# Patient Record
Sex: Female | Born: 1955 | Race: White | Hispanic: No | Smoking: Former smoker
Health system: Southern US, Community
[De-identification: ages and names within clinical notes are randomized; demographics above are authoritative.]

## PROBLEM LIST (undated history)

## (undated) DIAGNOSIS — I10 Essential (primary) hypertension: Secondary | ICD-10-CM

## (undated) HISTORY — PX: FOOT SURGERY: SHX648

## (undated) HISTORY — PX: TONSILLECTOMY: SUR1361

---

## 2013-04-25 DIAGNOSIS — F329 Major depressive disorder, single episode, unspecified: Secondary | ICD-10-CM | POA: Insufficient documentation

## 2013-12-19 DIAGNOSIS — L237 Allergic contact dermatitis due to plants, except food: Secondary | ICD-10-CM | POA: Insufficient documentation

## 2015-09-05 ENCOUNTER — Other Ambulatory Visit: Payer: Self-pay | Admitting: Surgery

## 2015-09-05 DIAGNOSIS — S46912D Strain of unspecified muscle, fascia and tendon at shoulder and upper arm level, left arm, subsequent encounter: Secondary | ICD-10-CM

## 2015-09-05 DIAGNOSIS — S40012D Contusion of left shoulder, subsequent encounter: Secondary | ICD-10-CM

## 2015-09-14 ENCOUNTER — Ambulatory Visit
Admission: RE | Admit: 2015-09-14 | Discharge: 2015-09-14 | Disposition: A | Discharge status: Home / Self Care | Payer: Worker's Compensation | Source: Ambulatory Visit | Attending: Surgery | Admitting: Surgery

## 2015-09-14 DIAGNOSIS — S46912D Strain of unspecified muscle, fascia and tendon at shoulder and upper arm level, left arm, subsequent encounter: Secondary | ICD-10-CM

## 2015-09-14 DIAGNOSIS — S40012D Contusion of left shoulder, subsequent encounter: Secondary | ICD-10-CM

## 2015-09-24 DIAGNOSIS — M75112 Incomplete rotator cuff tear or rupture of left shoulder, not specified as traumatic: Secondary | ICD-10-CM | POA: Insufficient documentation

## 2015-10-02 ENCOUNTER — Other Ambulatory Visit: Payer: Self-pay | Admitting: Medical

## 2015-10-02 DIAGNOSIS — Z1231 Encounter for screening mammogram for malignant neoplasm of breast: Secondary | ICD-10-CM

## 2015-10-04 ENCOUNTER — Ambulatory Visit
Admission: RE | Admit: 2015-10-04 | Discharge: 2015-10-04 | Disposition: A | Discharge status: Home / Self Care | Payer: BLUE CROSS/BLUE SHIELD | Source: Ambulatory Visit | Attending: Medical | Admitting: Medical

## 2015-10-04 DIAGNOSIS — Z1231 Encounter for screening mammogram for malignant neoplasm of breast: Secondary | ICD-10-CM | POA: Diagnosis not present

## 2015-11-15 ENCOUNTER — Encounter: Payer: Self-pay | Admitting: *Deleted

## 2015-11-15 ENCOUNTER — Other Ambulatory Visit: Payer: Self-pay

## 2015-11-15 NOTE — Patient Instructions (Signed)
  Your procedure is scheduled on:11/27/15 Report to Day Surgery.  [facility]LO[facility] To find out your arrival time please call [phone] between 1PM - 3PM on 11/23/15  Remember: Instructions that are not followed completely may result in serious medical risk, up to and including death, or upon the discretion of your surgeon and anesthesiologist your surgery may need to be rescheduled.    __X__ 1. Do not eat food or drink liquids after midnight. No gum chewing or hard candies.     ___X_ 2. No Alcohol for 24 hours before or after surgery.   ____ 3. Bring all medications with you on the day of surgery if instructed.    __X__ 4. Notify your doctor if there is any change in your medical condition     (cold, fever, infections).     Do not wear jewelry, make-up, hairpins, clips or nail polish.  Do not wear lotions, powders, or perfumes. You may wear deodorant.  Do not shave 48 hours prior to surgery. Men may shave face and neck.  Do not bring valuables to the hospital.    [facility] any belongings or valuables.               Contacts, dentures or bridgework may not be worn into surgery.  Leave your suitcase in the car. After surgery it may be brought to your room.  For patients admitted to the hospital, discharge time is determined by your                treatment team.   Patients discharged the day of surgery will not be allowed to drive home.   Please read over the following fact sheets that you were given:   Surgical Site Infection Prevention   __X__ Take these medicines the morning of surgery with A SIP OF WATER:    1. METOPROLOL  2.   3.   4.  5.  6.  ____ Fleet Enema (as directed)   _X___ Use CHG Soap as directed  ____ Use inhalers on the day of surgery  ____ Stop metformin 2 days prior to surgery    ____ Take 1/2 of usual insulin dose the night before surgery and none on the morning of surgery.   ____ Stop Coumadin/Plavix/aspirin on   ____ Stop Anti-inflammatories on  ____ Stop supplements until after surgery.    ____ Bring C-Pap to the hospital.

## 2015-11-20 ENCOUNTER — Ambulatory Visit
Admission: RE | Admit: 2015-11-20 | Discharge: 2015-11-20 | Disposition: A | Discharge status: Home / Self Care | Payer: BLUE CROSS/BLUE SHIELD | Source: Ambulatory Visit | Attending: Surgery | Admitting: Surgery

## 2015-11-20 DIAGNOSIS — Z01812 Encounter for preprocedural laboratory examination: Secondary | ICD-10-CM | POA: Insufficient documentation

## 2015-11-20 DIAGNOSIS — Z01810 Encounter for preprocedural cardiovascular examination: Secondary | ICD-10-CM | POA: Insufficient documentation

## 2015-11-20 DIAGNOSIS — I10 Essential (primary) hypertension: Secondary | ICD-10-CM | POA: Insufficient documentation

## 2015-11-20 LAB — CBC
HCT: 39.5 % (ref 35.0–47.0)
HEMOGLOBIN: 13.4 g/dL (ref 12.0–16.0)
MCH: 29.7 pg (ref 26.0–34.0)
MCHC: 33.8 g/dL (ref 32.0–36.0)
MCV: 87.9 fL (ref 80.0–100.0)
Platelets: 291 10*3/uL (ref 150–440)
RBC: 4.49 MIL/uL (ref 3.80–5.20)
RDW: 13.1 % (ref 11.5–14.5)
WBC: 6 10*3/uL (ref 3.6–11.0)

## 2015-11-27 ENCOUNTER — Ambulatory Visit: Payer: Worker's Compensation | Admitting: Anesthesiology

## 2015-11-27 ENCOUNTER — Ambulatory Visit
Admission: RE | Admit: 2015-11-27 | Discharge: 2015-11-27 | Disposition: A | Discharge status: Home / Self Care | Payer: Worker's Compensation | Source: Ambulatory Visit | Attending: Surgery | Admitting: Surgery

## 2015-11-27 ENCOUNTER — Encounter
Admission: RE | Disposition: A | Discharge status: Home / Self Care | Payer: Self-pay | Source: Ambulatory Visit | Attending: Surgery

## 2015-11-27 DIAGNOSIS — M24112 Other articular cartilage disorders, left shoulder: Secondary | ICD-10-CM | POA: Insufficient documentation

## 2015-11-27 DIAGNOSIS — Z87892 Personal history of anaphylaxis: Secondary | ICD-10-CM | POA: Insufficient documentation

## 2015-11-27 DIAGNOSIS — Z79891 Long term (current) use of opiate analgesic: Secondary | ICD-10-CM | POA: Insufficient documentation

## 2015-11-27 DIAGNOSIS — Z91030 Bee allergy status: Secondary | ICD-10-CM | POA: Diagnosis not present

## 2015-11-27 DIAGNOSIS — Z87891 Personal history of nicotine dependence: Secondary | ICD-10-CM | POA: Diagnosis not present

## 2015-11-27 DIAGNOSIS — M19012 Primary osteoarthritis, left shoulder: Secondary | ICD-10-CM | POA: Insufficient documentation

## 2015-11-27 DIAGNOSIS — M75112 Incomplete rotator cuff tear or rupture of left shoulder, not specified as traumatic: Secondary | ICD-10-CM | POA: Diagnosis present

## 2015-11-27 DIAGNOSIS — M94212 Chondromalacia, left shoulder: Secondary | ICD-10-CM | POA: Diagnosis not present

## 2015-11-27 DIAGNOSIS — Z791 Long term (current) use of non-steroidal anti-inflammatories (NSAID): Secondary | ICD-10-CM | POA: Insufficient documentation

## 2015-11-27 DIAGNOSIS — Z91013 Allergy to seafood: Secondary | ICD-10-CM | POA: Insufficient documentation

## 2015-11-27 DIAGNOSIS — Z79899 Other long term (current) drug therapy: Secondary | ICD-10-CM | POA: Diagnosis not present

## 2015-11-27 DIAGNOSIS — Z91018 Allergy to other foods: Secondary | ICD-10-CM | POA: Diagnosis not present

## 2015-11-27 HISTORY — DX: Essential (primary) hypertension: I10

## 2015-11-27 HISTORY — PX: SHOULDER ARTHROSCOPY WITH OPEN ROTATOR CUFF REPAIR: SHX6092

## 2015-11-27 SURGERY — ARTHROSCOPY, SHOULDER WITH REPAIR, ROTATOR CUFF, OPEN
Anesthesia: General | Laterality: Left | Wound class: Clean

## 2015-11-27 MED ORDER — BUPIVACAINE-EPINEPHRINE 0.5% -1:200000 IJ SOLN
INTRAMUSCULAR | Status: DC | PRN
  Administered 2015-11-27 (×2): 30 mL

## 2015-11-27 MED ORDER — ROCURONIUM BROMIDE 100 MG/10ML IV SOLN
INTRAVENOUS | Status: DC | PRN
  Administered 2015-11-27: 5 mg via INTRAVENOUS

## 2015-11-27 MED ORDER — BUPIVACAINE-EPINEPHRINE (PF) 0.5% -1:200000 IJ SOLN
INTRAMUSCULAR | Status: AC
  Filled 2015-11-27: qty 30

## 2015-11-27 MED ORDER — FENTANYL CITRATE (PF) 100 MCG/2ML IJ SOLN
25.0000 ug | INTRAMUSCULAR | Status: DC | PRN
  Administered 2015-11-27 (×3): 50 ug via INTRAVENOUS

## 2015-11-27 MED ORDER — CEFAZOLIN SODIUM-DEXTROSE 2-4 GM/100ML-% IV SOLN
2.0000 g | Freq: Once | INTRAVENOUS | Status: AC
  Administered 2015-11-27: 2 g via INTRAVENOUS

## 2015-11-27 MED ORDER — FENTANYL CITRATE (PF) 100 MCG/2ML IJ SOLN
INTRAMUSCULAR | Status: AC
  Administered 2015-11-27: 50 ug via INTRAVENOUS
  Filled 2015-11-27: qty 2

## 2015-11-27 MED ORDER — HYDROMORPHONE HCL 1 MG/ML IJ SOLN
INTRAMUSCULAR | Status: AC
  Administered 2015-11-27: 0.5 mg via INTRAVENOUS
  Filled 2015-11-27: qty 1

## 2015-11-27 MED ORDER — FENTANYL CITRATE (PF) 100 MCG/2ML IJ SOLN
INTRAMUSCULAR | Status: DC | PRN
  Administered 2015-11-27: 50 ug via INTRAVENOUS
  Administered 2015-11-27: 100 ug via INTRAVENOUS
  Administered 2015-11-27: 50 ug via INTRAVENOUS

## 2015-11-27 MED ORDER — OXYCODONE HCL 5 MG/5ML PO SOLN: 5.0000 mg | Freq: Once | ORAL | Status: DC | PRN

## 2015-11-27 MED ORDER — CEFAZOLIN SODIUM-DEXTROSE 2-4 GM/100ML-% IV SOLN
INTRAVENOUS | Status: AC
  Filled 2015-11-27: qty 100

## 2015-11-27 MED ORDER — OXYCODONE HCL 5 MG PO TABS: 5.0000 mg | ORAL_TABLET | Freq: Once | ORAL | Status: DC | PRN

## 2015-11-27 MED ORDER — FAMOTIDINE 20 MG PO TABS
20.0000 mg | ORAL_TABLET | Freq: Once | ORAL | Status: AC
  Administered 2015-11-27: 20 mg via ORAL

## 2015-11-27 MED ORDER — LACTATED RINGERS IV SOLN
INTRAVENOUS | Status: DC
  Administered 2015-11-27 (×2): via INTRAVENOUS

## 2015-11-27 MED ORDER — SUCCINYLCHOLINE CHLORIDE 20 MG/ML IJ SOLN
INTRAMUSCULAR | Status: DC | PRN
  Administered 2015-11-27: 100 mg via INTRAVENOUS

## 2015-11-27 MED ORDER — EPINEPHRINE HCL 1 MG/ML IJ SOLN
INTRAMUSCULAR | Status: AC
  Filled 2015-11-27: qty 2

## 2015-11-27 MED ORDER — HYDROMORPHONE HCL 1 MG/ML IJ SOLN
0.2500 mg | INTRAMUSCULAR | Status: DC | PRN
  Administered 2015-11-27 (×2): 0.5 mg via INTRAVENOUS

## 2015-11-27 MED ORDER — ONDANSETRON HCL 4 MG/2ML IJ SOLN
INTRAMUSCULAR | Status: DC | PRN
  Administered 2015-11-27: 4 mg via INTRAVENOUS

## 2015-11-27 MED ORDER — METOCLOPRAMIDE HCL 5 MG/ML IJ SOLN
INTRAMUSCULAR | Status: AC
  Filled 2015-11-27: qty 2

## 2015-11-27 MED ORDER — EPHEDRINE SULFATE 50 MG/ML IJ SOLN
INTRAMUSCULAR | Status: DC | PRN
  Administered 2015-11-27 (×3): 10 mg via INTRAVENOUS

## 2015-11-27 MED ORDER — EPINEPHRINE HCL 1 MG/ML IJ SOLN
INTRAMUSCULAR | Status: DC | PRN
  Administered 2015-11-27: 2 mL

## 2015-11-27 MED ORDER — DEXAMETHASONE SODIUM PHOSPHATE 10 MG/ML IJ SOLN
INTRAMUSCULAR | Status: DC | PRN
  Administered 2015-11-27: 10 mg via INTRAVENOUS

## 2015-11-27 MED ORDER — ONDANSETRON HCL 4 MG/2ML IJ SOLN
4.0000 mg | Freq: Four times a day (QID) | INTRAMUSCULAR | Status: DC | PRN
  Administered 2015-11-27: 4 mg via INTRAVENOUS

## 2015-11-27 MED ORDER — METOCLOPRAMIDE HCL 10 MG PO TABS: 5.0000 mg | ORAL_TABLET | Freq: Three times a day (TID) | ORAL | Status: DC | PRN

## 2015-11-27 MED ORDER — PROPOFOL 10 MG/ML IV BOLUS
INTRAVENOUS | Status: DC | PRN
  Administered 2015-11-27: 130 mg via INTRAVENOUS

## 2015-11-27 MED ORDER — POTASSIUM CHLORIDE IN NACL 20-0.9 MEQ/L-% IV SOLN
INTRAVENOUS | Status: DC
Start: 2015-11-27 — End: 2015-11-27
  Filled 2015-11-27: qty 1000

## 2015-11-27 MED ORDER — GLYCOPYRROLATE 0.2 MG/ML IJ SOLN
INTRAMUSCULAR | Status: DC | PRN
  Administered 2015-11-27: 0.2 mg via INTRAVENOUS

## 2015-11-27 MED ORDER — OXYCODONE HCL 5 MG PO TABS: 5.0000 mg | ORAL_TABLET | ORAL | Status: DC | PRN

## 2015-11-27 MED ORDER — ROPIVACAINE HCL 5 MG/ML IJ SOLN
INTRAMUSCULAR | Status: AC
  Filled 2015-11-27: qty 40

## 2015-11-27 MED ORDER — MIDAZOLAM HCL 2 MG/2ML IJ SOLN
INTRAMUSCULAR | Status: DC | PRN
  Administered 2015-11-27 (×2): 1 mg via INTRAVENOUS

## 2015-11-27 MED ORDER — ONDANSETRON HCL 4 MG PO TABS: 4.0000 mg | ORAL_TABLET | Freq: Four times a day (QID) | ORAL | Status: DC | PRN

## 2015-11-27 MED ORDER — METOCLOPRAMIDE HCL 5 MG/ML IJ SOLN
5.0000 mg | Freq: Three times a day (TID) | INTRAMUSCULAR | Status: DC | PRN
  Administered 2015-11-27: 10 mg via INTRAVENOUS

## 2015-11-27 MED ORDER — ONDANSETRON HCL 4 MG/2ML IJ SOLN
INTRAMUSCULAR | Status: AC
  Filled 2015-11-27: qty 2

## 2015-11-27 MED ORDER — FAMOTIDINE 20 MG PO TABS
ORAL_TABLET | ORAL | Status: AC
  Administered 2015-11-27: 20 mg via ORAL
  Filled 2015-11-27: qty 1

## 2015-11-27 SURGICAL SUPPLY — 48 items
ANCHOR JUGGERKNOT WTAP NDL 2.9 (Anchor) ×3 IMPLANT
BIT DRILL JUGRKNT W/NDL BIT2.9 (DRILL) ×2 IMPLANT
BLADE FULL RADIUS 3.5 (BLADE) ×3 IMPLANT
BLADE SHAVER 4.5X7 STR FR (MISCELLANEOUS) ×3 IMPLANT
BUR ACROMIONIZER 4.0 (BURR) ×3 IMPLANT
BUR BR 5.5 WIDE MOUTH (BURR) ×3 IMPLANT
CANNULA 8.5X75 THRED (CANNULA) ×3 IMPLANT
CANNULA SHAVER 8MMX76MM (CANNULA) ×3 IMPLANT
CHLORAPREP W/TINT 26ML (MISCELLANEOUS) ×6 IMPLANT
COVER MAYO STAND STRL (DRAPES) ×3 IMPLANT
DRAPE IMP U-DRAPE 54X76 (DRAPES) ×3 IMPLANT
DRAPE SURG 17X11 SM STRL (DRAPES) ×3 IMPLANT
DRILL JUGGERKNOT W/NDL BIT 2.9 (DRILL) ×6
DRSG OPSITE POSTOP 4X8 (GAUZE/BANDAGES/DRESSINGS) ×3 IMPLANT
ELECT REM PT RETURN 9FT ADLT (ELECTROSURGICAL) ×3
ELECTRODE REM PT RTRN 9FT ADLT (ELECTROSURGICAL) ×1 IMPLANT
GAUZE PETRO XEROFOAM 1X8 (MISCELLANEOUS) ×3 IMPLANT
GAUZE SPONGE 4X4 12PLY STRL (GAUZE/BANDAGES/DRESSINGS) ×3 IMPLANT
GLOVE BIO SURGEON STRL SZ7.5 (GLOVE) ×6 IMPLANT
GLOVE BIO SURGEON STRL SZ8 (GLOVE) ×6 IMPLANT
GLOVE BIOGEL PI IND STRL 8 (GLOVE) ×1 IMPLANT
GLOVE BIOGEL PI INDICATOR 8 (GLOVE) ×2
GLOVE INDICATOR 8.0 STRL GRN (GLOVE) ×3 IMPLANT
GOWN STRL REUS W/ TWL LRG LVL3 (GOWN DISPOSABLE) ×2 IMPLANT
GOWN STRL REUS W/ TWL XL LVL3 (GOWN DISPOSABLE) ×1 IMPLANT
GOWN STRL REUS W/TWL LRG LVL3 (GOWN DISPOSABLE) ×4
GOWN STRL REUS W/TWL XL LVL3 (GOWN DISPOSABLE) ×2
GRASPER SUT 15 45D LOW PRO (SUTURE) ×6 IMPLANT
IV LACTATED RINGER IRRG 3000ML (IV SOLUTION) ×4
IV LR IRRIG 3000ML ARTHROMATIC (IV SOLUTION) ×2 IMPLANT
MANIFOLD NEPTUNE II (INSTRUMENTS) ×3 IMPLANT
MASK FACE SPIDER DISP (MASK) ×3 IMPLANT
MAT BLUE FLOOR 46X72 FLO (MISCELLANEOUS) ×3 IMPLANT
NEEDLE REVERSE CUT 1/2 CRC (NEEDLE) ×3 IMPLANT
PACK ARTHROSCOPY SHOULDER (MISCELLANEOUS) ×3 IMPLANT
SLING ARM LRG DEEP (SOFTGOODS) ×3 IMPLANT
SLING ULTRA II LG (MISCELLANEOUS) ×3 IMPLANT
STAPLER SKIN PROX 35W (STAPLE) ×3 IMPLANT
STRAP SAFETY BODY (MISCELLANEOUS) ×3 IMPLANT
SUT ETHIBOND 0 MO6 C/R (SUTURE) ×3 IMPLANT
SUT PROLENE 4 0 PS 2 18 (SUTURE) ×3 IMPLANT
SUT VIC AB 2-0 CT1 27 (SUTURE) ×4
SUT VIC AB 2-0 CT1 TAPERPNT 27 (SUTURE) ×2 IMPLANT
TAPE MICROFOAM 4IN (TAPE) ×3 IMPLANT
TUBING ARTHRO INFLOW-ONLY STRL (TUBING) ×3 IMPLANT
TUBING CONNECTING 10 (TUBING) ×2 IMPLANT
TUBING CONNECTING 10' (TUBING) ×1
WAND HAND CNTRL MULTIVAC 90 (MISCELLANEOUS) ×3 IMPLANT

## 2015-11-27 NOTE — Discharge Instructions (Addendum)
Keep dressing dry and intact.  May shower after dressing changed on post-op day #4 (Saturday).  Cover staples/sutures with Band-Aids after drying off. Apply ice frequently to shoulder. Keep shoulder immobilizer on at all times except may remove for bathing purposes. Follow-up in 10-14 days or as scheduled.  AMBULATORY SURGERY  DISCHARGE INSTRUCTIONS   1) The drugs that you were given will stay in your system until tomorrow so for the next 24 hours you should not:  A) Drive an automobile B) Make any legal decisions C) Drink any alcoholic beverage   2) You may resume regular meals tomorrow.  Today it is better to start with liquids and gradually work up to solid foods.  You may eat anything you prefer, but it is better to start with liquids, then soup and crackers, and gradually work up to solid foods.   3) Please notify your doctor immediately if you have any unusual bleeding, trouble breathing, redness and pain at the surgery site, drainage, fever, or pain not relieved by medication.    4) Additional Instructions:        Please contact your physician with any problems or Same Day Surgery at [phone], Monday through Friday 6 am to 4 pm, or [place] at [facility] Main number at [phone].

## 2015-11-27 NOTE — Op Note (Addendum)
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   Postoperative diagnosis: Impingement/tendinopathy with partial-thickness rotator cuff tear, labral fraying, and early degenerative joint disease, left shoulder.  Procedure: Extensive arthroscopic debridement, arthroscopic subacromial decompression, and mini-open rotator cuff repair, left shoulder.  Anesthesia: GET  Surgeon:   Maryagnes AmosJ. Jeffrey Poggi, MD  Assistant:   Horris LatinoLance McGhee, PA-C  Findings: As abothere was a near full-thickness articular surface rotator cuff tear involving the anterior insertional fibers of the supraspinatus. The remainder of the rotator cuff was in satisfactory condition. There was extensive labral fraying anteriorly, superiorly, and posteriorly. 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 The biceps tendon was in satisfactory condition.  Complications: None  Fluids:    700 cc  Estimated blood loss:  5 cc  Tourniquet time: None  Drains: None  Closure: Staples   Brief clinical note: The patient is is a 60 year old female who sustained a work-related injury to her left shoulder approximate 1 year ago. The patient's symptoms have progressed despite medications, activity modification, etc. The patient's history and examination are consistent with impingement/tendinopathy with a probable rotator cuff tear. These findings were confirmed by MRI scan. The patient presents at this time for definitive management of these shoulder symptoms.  Procedure: The patient was brought into the operating room and lain in the supine position. After adequate IV sedation was achieved, the patient underwent attempted placement of an interscalene block by the  anesthesiologist, but the patient could not tolerate the procedure despite good IV sedation so the procedure was aborted. The patient then underwent general endotracheal intubation and anesthesia before being repositioned in the beach chair position using the beach chair positioner. The left shoulder and upper extremity were prepped with ChloraPrep solution before being draped sterilely. Preoperative antibiotics were administered. A timeout was performed to confirm the proper surgical site before the expected portal sites and incision site were injected with 0.5% Sensorcaine with epinephrine. A posterior portal was created and the glenohumeral joint thoroughly inspected with the findings as described above. An anterior portal was created using an outside-in technique. The labrum and rotator cuff were further probed, again confirming the above-noted findings. The areas of labral fraying, synovitis, and articular damage were debrided back to stable margins using the full-radius resector. Subsequent probing of the labrum demonstrated that the labral attachment to the glenoid was intact. The biceps also was probed and found to have no significant tendinopathy changes. The ArthroCare wand was inserted and used to obtain hemostasis as well as to "anneal" the labrum anteriorly, superiorly, and posteriorly. The instruments were removed from the joint after suctioning the excess fluid.  The camera was repositioned through the posterior portal into the subacromial space. A separate lateral portal was created using an outside-in technique. The 3.5 mm full-radius resector was introduced and used to perform a subtotal bursectomy. The ArthroCare wand was then inserted and used to remove the periosteal tissue off the undersurface of the anterior third of the acromion as well as to recess the coracoacromial ligament from its attachment along the anterior and lateral margins of the acromion. The 4.0 mm acromionizing bur was  introduced and used to complete the decompression by removing the undersurface of the anterior third of the acromion. The full radius resector was reintroduced to  remove any residual bony debris before the ArthroCare wand was reintroduced to obtain hemostasis. The instruments were then removed from the subacromial space after suctioning the excess fluid.  An approximately 4-5 cm incision was made over the anterolateral aspect of the shoulder beginning at the anterolateral corner of the acromion and extending distally in line with the bicipital groove. This incision was carried down through the subcutaneous tissues to expose the deltoid fascia. The raphae between the anterior and middle thirds was identified and this plane developed to provide access into the subacromial space. Additional bursal tissues were debrided sharply using Metzenbaum scissors. The  partial-thickness articular surface rotator cuff tear was identified by palpation. A short incision was made longitudinally over this area to complete the tear before the margins were debrided sharply with a #15 blade and the exposed greater tuberosity roughened with a rongeur. The tear was repaired usia single Biomet 2.9 mm JuggerKnot anchor.  Both sets of sutures were tied in a side-to-side fashion to close the defect. Several additional #0 Ethibond interrupted sutures were used to complete the closure. An apparent watertight closure was obtained.  The wound was copiously irrigated with sterile saline solution before the deltoid raphae was reapproximated using 2-0 Vicryl interrupted sutures. The subcutaneous tissues were closed in two layers using 2-0 Vicryl interrupted sutures before the skin was closed using staples. The portal sites also were closed using staples. Because there was no block placed preoperatively, an additional 30 cc of 0.5% Sensorcaine with epinephrine was injected in and around the incisions, as well as in the subacromial space, in order  to help with postoperative analgesia. A sterile bulky dressing was applied to the shoulder before the arm was placed into a shoulder immobilizer. The patient was then awakened, extubated, and returned to the recovery room in satisfactory condition after tolerating the procedure well.

## 2015-11-27 NOTE — Transfer of Care (Signed)
Immediate Anesthesia Transfer of Care Note  Patient: Traci Ray  Procedure(s) Performed: Procedure(s): SHOULDER ARTHROSCOPY WITHDebridment, decompression, mini OPEN ROTATOR CUFF REPAIR (Left)  Patient Location: PACU  Anesthesia Type:General  Level of Consciousness: sedated and responds to stimulation  Airway & Oxygen Therapy: Patient Spontanous Breathing and Patient connected to nasal cannula oxygen  Post-op Assessment: Report given to RN and Post -op Vital signs reviewed and stable  Post vital signs: Reviewed and stable  Last Vitals:  Filed Vitals:   11/27/15 0604 11/27/15 0921  BP: 177/92 169/74  Pulse: 50 71  Temp: 36.2 C 36.2 C  Resp: 16 12    Last Pain:  Filed Vitals:   11/27/15 0927  PainSc: Asleep         Complications: No apparent anesthesia complications

## 2015-11-27 NOTE — Anesthesia Postprocedure Evaluation (Signed)
Anesthesia Post Note  Patient: Andrew AuFern Mechling  Procedure(s) Performed: Procedure(s) (LRB): SHOULDER ARTHROSCOPY WITHDebridment, decompression, mini OPEN ROTATOR CUFF REPAIR (Left)  Patient location during evaluation: PACU Anesthesia Type: General Level of consciousness: awake and alert Pain management: pain level controlled Vital Signs Assessment: post-procedure vital signs reviewed and stable Respiratory status: spontaneous breathing, nonlabored ventilation, respiratory function stable and patient connected to nasal cannula oxygen Cardiovascular status: blood pressure returned to baseline and stable Postop Assessment: no signs of nausea or vomiting Anesthetic complications: no    Last Vitals:  Filed Vitals:   11/27/15 1006 11/27/15 1035  BP: 153/89 175/81  Pulse: 51 51  Temp: 36.4 C 35.8 C  Resp: 13 16    Last Pain:  Filed Vitals:   11/27/15 1123  PainSc: 4                  Joseph K Piscitello

## 2015-11-27 NOTE — H&P (Signed)
Paper H&P to be scanned into permanent record. H&P reviewed. No changes. 

## 2015-11-27 NOTE — Anesthesia Procedure Notes (Signed)
Procedure Name: Intubation Date/Time: 11/27/2015 7:39 AM Performed by: Omer JackWEATHERLY, Dodi Leu Pre-anesthesia Checklist: Patient identified, Patient being monitored, Timeout performed, Emergency Drugs available and Suction available Patient Re-evaluated:Patient Re-evaluated prior to inductionOxygen Delivery Method: Circle system utilized Preoxygenation: Pre-oxygenation with 100% oxygen Intubation Type: IV induction Ventilation: Mask ventilation without difficulty Laryngoscope Size: Mac and 3 Grade View: Grade I Tube type: Oral Tube size: 7.0 mm Number of attempts: 1 Placement Confirmation: ETT inserted through vocal cords under direct vision,  positive ETCO2 and breath sounds checked- equal and bilateral Secured at: 21 cm Tube secured with: Tape Dental Injury: Teeth and Oropharynx as per pre-operative assessment

## 2015-11-27 NOTE — Anesthesia Preprocedure Evaluation (Addendum)
Anesthesia Evaluation  Patient identified by MRN, date of birth, ID band Patient awake    Reviewed: Allergy & Precautions, H&P , NPO status , Patient's Chart, lab work & pertinent test results  Airway Mallampati: III  TM Distance: >3 FB Neck ROM: limited    Dental  (+) Poor Dentition, Partial Upper, Partial Lower, Chipped, Missing   Pulmonary COPD, former smoker,    Pulmonary exam normal breath sounds clear to auscultation       Cardiovascular Exercise Tolerance: Good hypertension, (-) angina(-) Past MI and (-) DOE Normal cardiovascular exam Rhythm:regular Rate:Normal     Neuro/Psych negative neurological ROS  negative psych ROS   GI/Hepatic negative GI ROS, Neg liver ROS, neg GERD  ,  Endo/Other  negative endocrine ROS  Renal/GU negative Renal ROS  negative genitourinary   Musculoskeletal   Abdominal   Peds  Hematology negative hematology ROS (+)   Anesthesia Other Findings Past Medical History:   Hypertension                                                Past Surgical History:   TONSILLECTOMY                                                 FOOT SURGERY                                                 BMI    Body Mass Index   29.06 kg/m 2    Patient reports baseline weakness in surgical arm and shoulder.   Reproductive/Obstetrics negative OB ROS                            Anesthesia Physical Anesthesia Plan  ASA: III  Anesthesia Plan: General ETT   Post-op Pain Management:  Regional for Post-op pain   Induction:   Airway Management Planned:   Additional Equipment:   Intra-op Plan:   Post-operative Plan:   Informed Consent: I have reviewed the patients History and Physical, chart, labs and discussed the procedure including the risks, benefits and alternatives for the proposed anesthesia with the patient or authorized representative who has indicated his/her  understanding and acceptance.   Dental Advisory Given  Plan Discussed with: Anesthesiologist, CRNA and Surgeon  Anesthesia Plan Comments:         Anesthesia Quick Evaluation

## 2016-09-05 ENCOUNTER — Ambulatory Visit: Payer: Self-pay | Admitting: Medical

## 2016-09-05 ENCOUNTER — Encounter: Payer: Self-pay | Admitting: Medical

## 2016-09-05 VITALS — BP 112/78 | HR 64 | Temp 97.3°F | Resp 16 | Ht 64.0 in | Wt 180.0 lb

## 2016-09-05 DIAGNOSIS — M545 Low back pain, unspecified: Secondary | ICD-10-CM

## 2016-09-05 LAB — POCT URINALYSIS DIPSTICK
Bilirubin, UA: NEGATIVE
Glucose, UA: NEGATIVE
KETONES UA: NEGATIVE
Leukocytes, UA: NEGATIVE
Nitrite, UA: NEGATIVE
PH UA: 6
PROTEIN UA: NEGATIVE
UROBILINOGEN UA: NEGATIVE

## 2016-09-05 MED ORDER — CYCLOBENZAPRINE HCL 5 MG PO TABS: 5.0000 mg | ORAL_TABLET | Freq: Every day | ORAL | 0 refills | Status: DC

## 2016-09-05 MED ORDER — IBUPROFEN 600 MG PO TABS: 600.0000 mg | ORAL_TABLET | Freq: Three times a day (TID) | ORAL | 0 refills | Status: DC | PRN

## 2016-09-05 NOTE — Progress Notes (Signed)
   Subjective:    Patient ID: Traci Ray, female    DOB: [birth year], 61 y.o.   MRN: [id number]  [id number] yo female with  low back pain on right side x one month. No radiation. Patient decribes pain as dull ache. Working 2 jobs to help catch up due to recent left shoulder May 30th 2017 and being on disability.  Normally gets up at night to urinate. Denies burning, frequency, hematuria or fever. A friend suggested she come [facility] to be check for UTI. Did not work last night due to back pain. Needs a note for work.    Review of Systems  Constitutional: Negative for fever.  Eyes: Negative for itching.  Respiratory: Negative for shortness of breath.   Cardiovascular: Negative for chest pain.  Genitourinary: Negative for decreased urine volume, difficulty urinating, dysuria, flank pain and hematuria.  Musculoskeletal: Positive for back pain.  Neurological: Negative for numbness.  back pain located on right side.     Objective:   Physical Exam  Constitutional: She is oriented to person, place, and time. She appears well-developed and well-nourished.  HENT:  Head: Normocephalic and atraumatic.  Eyes: Pupils are equal, round, and reactive to light.  Musculoskeletal: Normal range of motion. She exhibits tenderness.  Neurological: She is alert and oriented to person, place, and time. She has normal strength.  Reflex Scores:      Patellar reflexes are 2+ on the right side and 2+ on the left side. Skin: Skin is warm, dry and intact.  mildly tender with palp on right side  , positive muscle spasm Negative SLR bilaterally        Assessment & Plan:  Given note "seen in clinic  09/05/16" Heat and ice to the area Treated with ibuprofen 600mg  and flexeril 5mg  escripted to patient's pharmacy. Return to clinic in 5-10 d if not improving sooner if worsening.

## 2016-09-05 NOTE — Patient Instructions (Signed)
Heat/ ice to area. Rest. Take ibuprofen and cyclobenzaprine as directed.  Return to clinic in 5-10 days if not improving.

## 2016-09-10 ENCOUNTER — Other Ambulatory Visit: Payer: Self-pay | Admitting: Medical

## 2016-10-09 ENCOUNTER — Other Ambulatory Visit: Payer: Self-pay | Admitting: Medical

## 2016-10-09 DIAGNOSIS — M545 Low back pain, unspecified: Secondary | ICD-10-CM

## 2016-10-09 MED ORDER — CYCLOBENZAPRINE HCL 5 MG PO TABS: 5.0000 mg | ORAL_TABLET | Freq: Every day | ORAL | 0 refills | Status: DC

## 2016-10-14 ENCOUNTER — Other Ambulatory Visit: Payer: Self-pay | Admitting: Medical

## 2016-10-14 DIAGNOSIS — Z1231 Encounter for screening mammogram for malignant neoplasm of breast: Secondary | ICD-10-CM

## 2016-10-15 ENCOUNTER — Ambulatory Visit
Admission: RE | Admit: 2016-10-15 | Discharge: 2016-10-15 | Disposition: A | Discharge status: Home / Self Care | Payer: BLUE CROSS/BLUE SHIELD | Source: Ambulatory Visit | Attending: Medical | Admitting: Medical

## 2016-10-15 ENCOUNTER — Ambulatory Visit: Payer: Self-pay | Admitting: Medical

## 2016-10-15 ENCOUNTER — Encounter: Payer: Self-pay | Admitting: Medical

## 2016-10-15 ENCOUNTER — Telehealth: Payer: Self-pay

## 2016-10-15 VITALS — BP 130/80 | HR 70 | Temp 97.5°F | Resp 12 | Wt 180.0 lb

## 2016-10-15 DIAGNOSIS — I10 Essential (primary) hypertension: Secondary | ICD-10-CM

## 2016-10-15 DIAGNOSIS — M545 Low back pain, unspecified: Secondary | ICD-10-CM

## 2016-10-15 DIAGNOSIS — Z1231 Encounter for screening mammogram for malignant neoplasm of breast: Secondary | ICD-10-CM | POA: Diagnosis not present

## 2016-10-15 DIAGNOSIS — Z760 Encounter for issue of repeat prescription: Secondary | ICD-10-CM

## 2016-10-15 MED ORDER — PREDNISONE 10 MG (21) PO TBPK: ORAL_TABLET | ORAL | 0 refills | Status: DC

## 2016-10-15 MED ORDER — CYCLOBENZAPRINE HCL 5 MG PO TABS: 5.0000 mg | ORAL_TABLET | Freq: Every day | ORAL | 0 refills | Status: DC

## 2016-10-15 MED ORDER — METOPROLOL SUCCINATE ER 50 MG PO TB24: 50.0000 mg | ORAL_TABLET | Freq: Every day | ORAL | 0 refills | Status: DC

## 2016-10-15 NOTE — Progress Notes (Signed)
   Subjective:    Patient ID: Traci Ray, female    DOB: 06-21-1956, 61 y.o.   MRN: [id number]  HPI 61 yo female with low back pain, seen on 09/05/16 for right sided back pain , so today worsening pain now presenting all the way across the lower back..  Recently returned to work after left should surgery. Now working just one job (last visit was working two jobs). Cannot afford to go to physical therapy.  No radiation of pain into legs, no numbness or tingling  Into legs. Would like some exercises to do to help with her back pain. "my back feels achy today".  No saddle paresthesia , no spontaneous loss of bowel or bladder. When I start working I get pain. 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 She says keys and walkie talkie bother her back.    Review of Systems  Constitutional: Negative for chills.  HENT: Positive for congestion and rhinorrhea. Negative for postnasal drip, sneezing and sore throat.   Eyes: Negative for itching.  Respiratory: Negative for cough, chest tightness and shortness of breath.   Cardiovascular: Negative for chest pain.  Gastrointestinal: Negative for diarrhea, nausea and vomiting.  Endocrine: Negative for cold intolerance and heat intolerance.  Genitourinary: Negative for dysuria.  Musculoskeletal: Positive for back pain. Negative for myalgias.  Allergic/Immunologic: Positive for environmental allergies and food allergies.  Neurological: Negative for dizziness, syncope and headaches.  Hematological: Negative for adenopathy.  Psychiatric/Behavioral: Negative for confusion and dysphoric mood.       Objective:   Physical Exam  Constitutional: She is oriented to person, place, and time. She appears well-developed and well-nourished.  HENT:  Head: Normocephalic and atraumatic.  Eyes: EOM are normal. Pupils are equal, round, and reactive to light.  Musculoskeletal: Normal range of motion.  Neurological: She is alert and oriented to person, place, and  time.  Skin: Skin is warm and dry.  Psychiatric: She has a normal mood and affect. Her behavior is normal.  Nursing note and vitals reviewed.   Non-tender to palpation vertebral spine and laterally. Easily bends over and touches the floor. Negative SLR 2+ bilateral popliteal and achilles reflexes.        Assessment & Plan:  Low back pain  Prescribed steroid dose pak   take as directed x 6 days #21 tab no refill and flexeril  one po qhs prn back spasms #30 no refill. Warm compresses to site. Exercises of pelvic tilt , knee(s) to chest  Single knee and both knees. Side twist of knees, lunges, ankle crossover opposite knee and stretching downward. Encouraged core strengthening. She is going to start doing yoga and working out at [employer].  Hypertension refilled patient Metoprolol Succ ER 50 mg one daily 90 day supply no refills Return to clinic as needed.

## 2016-10-23 NOTE — Telephone Encounter (Signed)
No note

## 2016-11-05 ENCOUNTER — Ambulatory Visit: Payer: Self-pay | Admitting: Medical

## 2016-11-05 DIAGNOSIS — I10 Essential (primary) hypertension: Secondary | ICD-10-CM | POA: Insufficient documentation

## 2016-11-05 DIAGNOSIS — M545 Low back pain, unspecified: Secondary | ICD-10-CM

## 2016-11-05 MED ORDER — CYCLOBENZAPRINE HCL 5 MG PO TABS: 5.0000 mg | ORAL_TABLET | Freq: Every day | ORAL | 0 refills | Status: DC

## 2016-11-05 NOTE — Progress Notes (Signed)
Subjective:    Patient ID: Traci Ray, female    DOB: [birth year], [age] y.o.   MRN: [id number]  HPI 61 yo returns for recheck of back pain seen on  09/05/16  and  10/15/16.  Has quit her 2nd job 2 weeks ago , which seems to have helped her back. Now still has back pain on the left side today rating it  3/10 today, no radiation into legs and  no numbness or tinglingl into legs. No saddle paraesthesia or  Loss of bowel or bladder. Would like to have a refill of muscle relaxers.  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  Traci Ray says she had some help today from a  colleague. Also states she now has a shorter scrub brush to clean showers , the other brush was 5 feet long and hard to scrub with. 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 She also now is carrying her walkie talkie in a bag instead of having to carry it around her hips.   Review of Systems  Constitutional: Negative for chills and fever.  HENT: Negative for congestion, ear pain, rhinorrhea and sneezing.   Eyes: Negative for discharge and itching.  Respiratory: Negative for chest tightness, shortness of breath and wheezing.   Cardiovascular: Negative for chest pain, palpitations and leg swelling.  Gastrointestinal: Negative for diarrhea, nausea and vomiting.  Endocrine: Negative for cold intolerance and heat intolerance.  Genitourinary: Negative for dysuria and hematuria.  Musculoskeletal: Positive for back pain. Negative for gait problem, joint swelling and myalgias.  Allergic/Immunologic: Positive for environmental allergies and food allergies.  Neurological: Negative for  syncope, facial asymmetry and light-headedness.  Hematological: Negative for adenopathy.  Psychiatric/Behavioral: Negative for confusion and hallucinations.   Her food allergy is shellfish.    Objective:   Physical Exam  Constitutional: She appears well-developed and well-nourished.  HENT:  Head: Normocephalic and atraumatic.  Eyes: Conjunctivae and EOM are normal. Pupils are equal, round, and reactive to light.  Neck: Normal range of motion.  Musculoskeletal: Normal range of motion.       Lumbar back: She exhibits no tenderness, no bony tenderness and no swelling.  Neurological: She is alert. She has normal strength. No cranial nerve deficit or sensory deficit. GCS eye subscore is 4. GCS verbal subscore is 5. GCS motor subscore is 6.  Reflex Scores:      Patellar reflexes are 2+ on the right side and 2+ on the left side.      Achilles reflexes are 2+ on the right side and 2+ on the left side. She is pleasant today and does not seem in pain. Negative straight leg bilaterally. 5/5 strength on flexion and extension of lower extremities. Gait within normal limits      Assessment & Plan:  Back pain improving refill of Cyclobenzaprine 5 mg one by mouth at night as needed for muscle spasm.. #30 no refills. She is using Ben-gay 2 to 3 times per week. Reviewed with patient not to use cool or warm compress at the same time as Ben-Gay. Patient says  Advil also  helps. No question at discharge. Reviewed with patient if she would like x-ray or MRI I will be happy to do the order. She declines any further test at this time. Return to clinic as needed. Rests after work. Uses Ice periodically and uses hot shower which also helps.

## 2016-11-05 NOTE — Patient Instructions (Signed)
Rest as needed.  Use warm or cool compresses without Ben-Gay as needed. Return to the clinic as needed.

## 2016-12-08 ENCOUNTER — Ambulatory Visit: Payer: Self-pay

## 2016-12-17 ENCOUNTER — Ambulatory Visit: Payer: Self-pay

## 2016-12-18 ENCOUNTER — Encounter: Payer: Self-pay | Admitting: Medical

## 2016-12-18 ENCOUNTER — Ambulatory Visit: Payer: Self-pay | Admitting: *Deleted

## 2016-12-18 VITALS — BP 142/90 | HR 59 | Temp 97.1°F | Resp 16 | Wt 180.0 lb

## 2016-12-18 DIAGNOSIS — I10 Essential (primary) hypertension: Secondary | ICD-10-CM

## 2016-12-22 ENCOUNTER — Ambulatory Visit: Payer: Self-pay | Admitting: Medical

## 2016-12-22 VITALS — BP 152/86 | HR 62 | Temp 97.8°F | Wt 178.6 lb

## 2016-12-22 DIAGNOSIS — M545 Low back pain, unspecified: Secondary | ICD-10-CM

## 2016-12-22 DIAGNOSIS — I10 Essential (primary) hypertension: Secondary | ICD-10-CM

## 2016-12-22 DIAGNOSIS — G8929 Other chronic pain: Secondary | ICD-10-CM

## 2016-12-22 DIAGNOSIS — M25512 Pain in left shoulder: Secondary | ICD-10-CM

## 2016-12-22 NOTE — Progress Notes (Signed)
   Subjective:    Patient ID: Traci Ray, female    DOB: 12-14-1955, 61 y.o.   MRN: [id number]  HPI 61 yo female comes in today for low back pain, but no pain now.  Last episode of pain 2 weeks ago. Moving furntiure on carpet. No radiation of pain.  No numbness or tingling. Also compaining of the itching and numbness and dull ache on left side of shoulder s/p rotator cuff repair 10/2015.  Has had this since repair "it's just aggravating" . Uses Voltaren gel to help with discomfort. Sometimes uses Voltaren gel on lower back for pain.   Patient concerned about blood pressure being elevated. Felt  1.5 weeks ago nauseated and lightheaded. Feels that the shoulder is causing her blood pressure to be elevated.   Review of Systems  Constitutional: Negative for appetite change, chills and fever.  HENT: Negative.   Eyes: Negative.   Respiratory: Negative.   Cardiovascular: Negative.   Gastrointestinal: Negative.   Genitourinary: Negative for dysuria and hematuria.  Musculoskeletal: Positive for back pain.  Skin: Negative for rash.  Allergic/Immunologic: Negative for environmental allergies.  Neurological: Positive for light-headedness. Negative for dizziness and syncope.  Hematological: Negative for adenopathy.  Psychiatric/Behavioral: Negative for confusion and hallucinations.   No back pain or light-headedness now.    Objective:   Physical Exam  Constitutional: She is oriented to person, place, and time. She appears well-developed and well-nourished.  HENT:  Head: Normocephalic and atraumatic.  Eyes: EOM are normal. Pupils are equal, round, and reactive to light.  Neck: Normal range of motion.  Musculoskeletal: Normal range of motion.  Neurological: She is alert and oriented to person, place, and time.  Skin: Skin is warm and dry.  Psychiatric: She has a normal mood and affect. Her behavior is normal. Judgment and thought content normal.  Nursing note and vitals reviewed.   Can easily  rotate left shoulder.  5/5 stength on flexion / extension  of lower legs.  2+ popliteal and achilles reflexes. , SLR negative bilaterally. Non-tender vertebral lumbar area, no muscle spasm today.Skin on back and shoulders within normal limits.     Assessment & Plan:   Low back pain no sciatica. Will do x-rays of Lumbar area. Patient has muscle relaxers she can use at home. Asked for narcotic pain medication for back at work, reviewed with patient this is not a good plan for treatment. Can try a prescription NSID to see if she can get relief when back is painful.  Hypertension to monitor.Come into clinic if feeling naseous and light-headed. For blood pressure recheck and evaluation.  And to come in next 3 mornings for nurse visit blood pressure recheck Coninued  Left shoulder tingling and numbness after surgery. If bothering her alot, to follow up with the surgeon who did the repair. Explained to patient about subcutaneous nerves being cut during procedure, can lead to numbness and tingling of the skin. Come into clinic if feeling naseous and lightheaded for evaluation.

## 2016-12-23 ENCOUNTER — Ambulatory Visit: Payer: Self-pay | Admitting: *Deleted

## 2016-12-23 VITALS — BP 152/92 | HR 55 | Temp 97.7°F | Resp 16

## 2016-12-23 DIAGNOSIS — I10 Essential (primary) hypertension: Secondary | ICD-10-CM

## 2016-12-23 MED ORDER — ETODOLAC 400 MG PO TABS: 400.0000 mg | ORAL_TABLET | Freq: Every day | ORAL | 1 refills | Status: DC

## 2016-12-23 NOTE — Patient Instructions (Signed)
Lumbar spine x-ray Lodine 400mg  one per day for left shoulder pain. Monitor blood pressure at clinic and at home.

## 2016-12-23 NOTE — Progress Notes (Signed)
   Subjective:    Patient ID: Traci Ray, female    DOB: [birth year], 61 y.o.   MRN: [id number]  HPI 61 yo female came in today for blood pressure recheck  152/92. Woke up feeling dizzy and nauseated. Was able to eat a breakfast bar and yogurt. Denies being dizzy or nauseated now.  Dizziness and nausesated symptoms  come and go . Not affecting her eating or drinking.   Review of Systems  Constitutional: Negative for appetite change.  Respiratory: Negative for shortness of breath.   Cardiovascular: Negative for chest pain.  Gastrointestinal: Positive for nausea.  Neurological: Positive for dizziness. Negative for syncope.       Objective:   Physical Exam  Constitutional: She is oriented to person, place, and time. She appears well-developed and well-nourished.  HENT:  Head: Normocephalic and atraumatic.  Right Ear: External ear normal.  Left Ear: External ear normal.  Mouth/Throat: Oropharynx is clear and moist.  Eyes: Conjunctivae and EOM are normal. Pupils are equal, round, and reactive to light.  Neck: Normal range of motion. Neck supple.  Cardiovascular: Normal rate, regular rhythm and normal heart sounds.   Pulmonary/Chest: Effort normal and breath sounds normal.  Neurological: She is alert and oriented to person, place, and time.  Skin: Skin is warm and dry.  Psychiatric: She has a normal mood and affect. Her behavior is normal. Thought content normal.  Nursing note and vitals reviewed.         Assessment & Plan:  Hypertension.  Will have patient go home and take another metoprolol 50 mg  And recheck blood pressure tomorrow. Reviewed with patient if not feeling well or dizzy after taking medication to return to the clinc for further examination. She says she got dizzy at home and ate something with her pill.  Reviewed with patient to drink more fluids ( water) due to the heat and working long shift hours. She verbalizes the understanding to return to the clinic if  not feeling better today. No questions at discharge.

## 2016-12-24 ENCOUNTER — Ambulatory Visit: Payer: Self-pay

## 2016-12-24 ENCOUNTER — Ambulatory Visit: Payer: Self-pay | Admitting: Medical

## 2016-12-24 VITALS — BP 128/80 | HR 54 | Temp 97.6°F | Resp 18 | Ht 65.0 in | Wt 177.0 lb

## 2016-12-24 VITALS — BP 146/88

## 2016-12-24 DIAGNOSIS — I10 Essential (primary) hypertension: Secondary | ICD-10-CM

## 2016-12-24 MED ORDER — HYDROCHLOROTHIAZIDE 12.5 MG PO TABS: 12.5000 mg | ORAL_TABLET | Freq: Every day | ORAL | 0 refills | Status: DC

## 2016-12-24 NOTE — Progress Notes (Signed)
   Subjective:    Patient ID: Traci Ray, female    DOB: 10-11-1955, 61 y.o.   MRN: [id number]  HPI  61 yo female returns for blood pressure  Recheck. . Improved however patent still feels dizzy and has a headache at the base of the head.  Hx Migraine.  Took  2 pills of the metoprolol to day.    Review of Systems  Constitutional: Negative for chills, fatigue and fever.  HENT: Negative for rhinorrhea and sore throat.   Eyes: Negative.   Respiratory: Negative for shortness of breath.   Cardiovascular: Negative for chest pain.  Gastrointestinal: Negative for abdominal pain.  Endocrine: Negative for polydipsia, polyphagia and polyuria.  Genitourinary: Negative for dysuria.  Musculoskeletal: Positive for back pain.  Neurological: Positive for dizziness and headaches. Negative for syncope.   Pending lumbar spine xray, patient just has not gone to get xray yet,    Objective:   Physical Exam  Constitutional: She is oriented to person, place, and time. She appears well-developed and well-nourished.  HENT:  Head: Normocephalic and atraumatic.  Eyes: EOM are normal. Pupils are equal, round, and reactive to light.  Neck: Normal range of motion.  Cardiovascular: Normal rate, regular rhythm and normal heart sounds.  Exam reveals no gallop and no friction rub.   No murmur heard. Pulmonary/Chest: Effort normal and breath sounds normal.  Neurological: She is alert and oriented to person, place, and time. She has normal strength. No cranial nerve deficit or sensory deficit. She displays a negative Romberg sign. GCS eye subscore is 4. GCS verbal subscore is 5. GCS motor subscore is 6.  Skin: Skin is warm and dry.  Psychiatric: She has a normal mood and affect. Her behavior is normal. Judgment and thought content normal.  Nursing note and vitals reviewed.     recheck  Hr  62. Assessment & Plan:  Hypertension, blood pressure improved however lower heart rate. This maybe causing her dizziness  and  Headache. Will check  CBC w/diff and Met C today.  Stop 2nd metoprolol and will start HCTZ 25 mg /day tomorrow. Patient says she cannot get medication till she gets paid on Friday. Will call patient with test results tomorrow. Summer cleaning stops August 24 th.Taking some vacation next week.Hydrate with water.  Rest for today.

## 2016-12-24 NOTE — Progress Notes (Signed)
Pt presents for NV/ Blood Pressure Check. Pt states that she does not feel that she can work today due to adjustment to change in medication. She states that she feels "funky" and lists sx as: slightly dizzy and nauseated. She states that she needs a day to adjust and requests a note to excuse her from work. She set up an appointment to see the provider this afternoon.

## 2016-12-25 ENCOUNTER — Telehealth: Payer: Self-pay | Admitting: Medical

## 2016-12-25 LAB — CMP12+LP+TP+TSH+6AC+CBC/D/PLT
ALBUMIN: 4.4 g/dL (ref 3.5–5.5)
ALT: 20 IU/L (ref 0–32)
AST: 19 IU/L (ref 0–40)
Albumin/Globulin Ratio: 1.6 (ref 1.2–2.2)
Alkaline Phosphatase: 77 IU/L (ref 39–117)
BUN/Creatinine Ratio: 20 (ref 9–23)
BUN: 18 mg/dL (ref 6–24)
Basophils Absolute: 0 10*3/uL (ref 0.0–0.2)
Basos: 0 %
Bilirubin Total: 0.3 mg/dL (ref 0.0–1.2)
CALCIUM: 9.7 mg/dL (ref 8.7–10.2)
CHOL/HDL RATIO: 5.4 ratio — AB (ref 0.0–4.4)
CREATININE: 0.92 mg/dL (ref 0.57–1.00)
Chloride: 103 mmol/L (ref 96–106)
Cholesterol, Total: 210 mg/dL — ABNORMAL HIGH (ref 100–199)
EOS (ABSOLUTE): 0.2 10*3/uL (ref 0.0–0.4)
Eos: 3 %
Estimated CHD Risk: 1.5 times avg. — ABNORMAL HIGH (ref 0.0–1.0)
Free Thyroxine Index: 1.7 (ref 1.2–4.9)
GFR calc Af Amer: 79 mL/min/{1.73_m2} (ref >59)
GFR, EST NON AFRICAN AMERICAN: 69 mL/min/{1.73_m2} (ref >59)
GGT: 10 IU/L (ref 0–60)
GLOBULIN, TOTAL: 2.7 g/dL (ref 1.5–4.5)
Glucose: 85 mg/dL (ref 65–99)
HDL: 39 mg/dL — ABNORMAL LOW (ref >39)
Hematocrit: 41.7 % (ref 34.0–46.6)
Hemoglobin: 14.4 g/dL (ref 11.1–15.9)
IRON: 94 ug/dL (ref 27–159)
Immature Grans (Abs): 0 10*3/uL (ref 0.0–0.1)
Immature Granulocytes: 0 %
LDH: 272 IU/L — AB (ref 119–226)
LYMPHS: 38 %
Lymphocytes Absolute: 2.5 10*3/uL (ref 0.7–3.1)
MCH: 30.3 pg (ref 26.6–33.0)
MCHC: 34.5 g/dL (ref 31.5–35.7)
MCV: 88 fL (ref 79–97)
MONOS ABS: 0.4 10*3/uL (ref 0.1–0.9)
Monocytes: 7 %
NEUTROS ABS: 3.5 10*3/uL (ref 1.4–7.0)
Neutrophils: 52 %
PHOSPHORUS: 4.3 mg/dL (ref 2.5–4.5)
POTASSIUM: 4.2 mmol/L (ref 3.5–5.2)
Platelets: 315 10*3/uL (ref 150–379)
RBC: 4.75 x10E6/uL (ref 3.77–5.28)
RDW: 13.9 % (ref 12.3–15.4)
Sodium: 145 mmol/L — ABNORMAL HIGH (ref 134–144)
T3 Uptake Ratio: 22 % — ABNORMAL LOW (ref 24–39)
T4 TOTAL: 7.8 ug/dL (ref 4.5–12.0)
TOTAL PROTEIN: 7.1 g/dL (ref 6.0–8.5)
TRIGLYCERIDES: 451 mg/dL — AB (ref 0–149)
TSH: 2.6 u[IU]/mL (ref 0.450–4.500)
Uric Acid: 5 mg/dL (ref 2.5–7.1)
WBC: 6.8 10*3/uL (ref 3.4–10.8)

## 2016-12-25 LAB — VITAMIN D 25 HYDROXY (VIT D DEFICIENCY, FRACTURES): VIT D 25 HYDROXY: 27.3 ng/mL — AB (ref 30.0–100.0)

## 2016-12-25 NOTE — Telephone Encounter (Signed)
Called patient today , she is getting blood pressure medication HCTZ  now, Reviewed labs. They are non fasting  And so lipids are invalid. Patient did not feel well again today and would like a note for work. Will complete and leave at front desk. Released.  Also recommended   Vit D3  2000IU/day

## 2016-12-29 ENCOUNTER — Ambulatory Visit: Payer: Self-pay | Admitting: *Deleted

## 2016-12-29 VITALS — BP 145/92 | HR 80

## 2016-12-29 DIAGNOSIS — I10 Essential (primary) hypertension: Secondary | ICD-10-CM

## 2017-01-01 ENCOUNTER — Ambulatory Visit: Payer: Self-pay | Admitting: *Deleted

## 2017-01-01 VITALS — BP 150/90 | HR 67

## 2017-01-01 DIAGNOSIS — I10 Essential (primary) hypertension: Secondary | ICD-10-CM

## 2017-01-09 ENCOUNTER — Ambulatory Visit: Payer: Self-pay

## 2017-01-09 VITALS — BP 130/76

## 2017-01-09 DIAGNOSIS — I10 Essential (primary) hypertension: Secondary | ICD-10-CM

## 2017-01-11 ENCOUNTER — Other Ambulatory Visit: Payer: Self-pay | Admitting: Medical

## 2017-01-11 DIAGNOSIS — I10 Essential (primary) hypertension: Secondary | ICD-10-CM

## 2017-01-13 ENCOUNTER — Other Ambulatory Visit: Payer: Self-pay | Admitting: Medical

## 2017-01-13 DIAGNOSIS — I10 Essential (primary) hypertension: Secondary | ICD-10-CM

## 2017-01-13 MED ORDER — METOPROLOL SUCCINATE ER 50 MG PO TB24: 50.0000 mg | ORAL_TABLET | Freq: Every day | ORAL | 0 refills | Status: AC

## 2017-01-13 MED ORDER — HYDROCHLOROTHIAZIDE 12.5 MG PO TABS: 12.5000 mg | ORAL_TABLET | Freq: Every day | ORAL | 0 refills | Status: AC

## 2017-01-13 NOTE — Telephone Encounter (Signed)
A refill of her Metoprolol and HCTZ has been completed.

## 2017-01-13 NOTE — Telephone Encounter (Signed)
Needs refill on blood pressure medication. Metoprolol and HCTZ. Completed for  90 days then recheck of blood pressure and labs.

## 2017-01-28 NOTE — Progress Notes (Unsigned)
Pt here for bp f/u, nurse appt. Did not need to see provider. Lurene ShadowBAllen, RN

## 2017-08-05 ENCOUNTER — Ambulatory Visit: Payer: Self-pay | Admitting: Medical

## 2017-08-19 ENCOUNTER — Encounter: Payer: Self-pay | Admitting: Medical

## 2017-08-19 ENCOUNTER — Ambulatory Visit: Payer: Self-pay | Admitting: Medical

## 2017-08-19 VITALS — BP 157/89 | HR 55 | Temp 97.0°F | Resp 18 | Ht 66.0 in | Wt 180.6 lb

## 2017-08-19 DIAGNOSIS — J0100 Acute maxillary sinusitis, unspecified: Secondary | ICD-10-CM

## 2017-08-19 DIAGNOSIS — R0981 Nasal congestion: Secondary | ICD-10-CM

## 2017-08-19 MED ORDER — AMOXICILLIN-POT CLAVULANATE 875-125 MG PO TABS: 1.0000 | ORAL_TABLET | Freq: Two times a day (BID) | ORAL | 0 refills | Status: DC

## 2017-08-19 MED ORDER — FLUTICASONE PROPIONATE 50 MCG/ACT NA SUSP: 2.0000 | Freq: Every day | NASAL | 6 refills | Status: DC

## 2017-08-19 NOTE — Progress Notes (Signed)
   Subjective:    Patient ID: Traci Ray, female    DOB: 1955/11/09, 62 y.o.   MRN: [id number]  HPI   62 yo female in non acute distress.  Patient complains of  2 week history of cold like symptoms, nasal congestion. Denies fever or chills, but felt hot at night. With yellow clear discharge Saturday night with left ear pain and throbbing 3/10 with the worst being  5/10 on Saturday ngiht..Feels "clogged".Dizzy initially for a week , but now it is gone.   Primary Care Provider  Jacklynn Ganonganiel Carter  Started therapy on left Shoulder again  5.5 weeks ago.Doing dry needling which is helping. Recent diagnosis of Sleep Apnea. But does not want to rent machine and deposit of $500 dollars.  Review of Systems  Constitutional: Positive for chills (initially first week), fatigue and fever (first week).  HENT: Positive for congestion, ear pain (saturday night), postnasal drip, sinus pressure and sinus pain. Negative for rhinorrhea, sneezing, sore throat and tinnitus.   Respiratory: Negative for cough.   Cardiovascular: Negative for chest pain.  Gastrointestinal: Negative for abdominal pain.  Genitourinary: Negative for dysuria.  Musculoskeletal: Positive for myalgias (2 night during the first week).  Skin: Negative for rash.  Neurological: Positive for dizziness ( 2 weeks ago), light-headedness (in the beginning) and headaches (off an on). Negative for syncope.  Hematological: Negative for adenopathy.       Objective:   Physical Exam  Constitutional: She is oriented to person, place, and time. She appears well-developed and well-nourished.  HENT:  Head: Normocephalic and atraumatic.  Right Ear: External ear normal.  Left Ear: External ear normal.  Eyes: Conjunctivae and EOM are normal. Pupils are equal, round, and reactive to light.  Neck: Normal range of motion. Neck supple.  Cardiovascular: Normal rate, regular rhythm and normal heart sounds.  Pulmonary/Chest: Effort normal and breath sounds  normal.  Musculoskeletal: Normal range of motion.  Neurological: She is alert and oriented to person, place, and time.  Skin: Skin is warm and dry.  Psychiatric: She has a normal mood and affect. Her behavior is normal. Judgment and thought content normal.  Nursing note and vitals reviewed.         Assessment & Plan:  Sinusitis maillary Meds ordered this encounter  Medications  . amoxicillin-clavulanate (AUGMENTIN) 875-125 MG tablet    Sig: Take 1 tablet by mouth 2 (two) times daily.    Dispense:  20 tablet    Refill:  0  Return in 3-5 days if not improving . Patient verbalizes understanding and has no questions at discharge. Patient asked for prescription of Flonase at discharge  due to hers at home being over a year old.  Prescription sent to pharmacy. Reviewed with patient Sleep apnea and hypertension and heart disease connection. To follow up with her doctor as needed.

## 2017-08-19 NOTE — Patient Instructions (Signed)

## 2017-10-12 ENCOUNTER — Ambulatory Visit: Payer: Self-pay | Admitting: Medical

## 2017-10-12 VITALS — BP 159/83 | HR 58 | Temp 97.7°F | Resp 18 | Ht 66.0 in | Wt 179.8 lb

## 2017-10-12 DIAGNOSIS — L089 Local infection of the skin and subcutaneous tissue, unspecified: Secondary | ICD-10-CM

## 2017-10-12 DIAGNOSIS — M79675 Pain in left toe(s): Secondary | ICD-10-CM

## 2017-10-12 NOTE — Progress Notes (Incomplete)
   Subjective:    Patient ID: Traci Ray, female    DOB: 1955/12/02, 62 y.o.   MRN: [id number]  HPI  62 yo female in non acute distress with history of  2 week left great toe pain. "My nail started to come off ( for the 3rd time) and I cut it way back to so it would not catch on my sock or shoe." It has been red and tender and draining pus for  2 days.  Denies any fever or chills or numbess or tingling.   There were no vitals taken for this visit.  Review of Systems  Constitutional: Negative for chills and fever.  Skin: Positive for wound (draining pus, left great toe).       Objective:   Physical Exam  Constitutional: Traci Ray is oriented to person, place, and time. Traci Ray appears well-developed and well-nourished.  Eyes: Pupils are equal, round, and reactive to light. Conjunctivae and EOM are normal.  Neurological: Traci Ray is alert and oriented to person, place, and time.  Skin: There is erythema (left great toe).  Psychiatric: Traci Ray has a normal mood and affect. Her behavior is normal. Judgment and thought content normal.  Nursing note and vitals reviewed.    Left great toe is swollen with erythema, toenail is cut back1/2 way where it is draining pus from underneath the toe nail. Patient had no dressing on the toe. Limited ROM secondary to swelling.<2sec CR     Assessment & Plan:  Left great toe infection. Reviewed with patient how to clean and dress toe, cleaned and neosporin placed on toe and  dressed in clinic. To take OTC Motrin 400 mg-600 mg every 6 hours with food for no more than  7 days, to help with pain and swelling. Referral made to podiatry. Given hand written prescription  Keflex 500 mg po TID x 7 days #21 no refills.  To clean toe twice daily, neosporin to the site and bandage to the area.  Patieent verbalizes understanding and has no questions at discharge.

## 2017-10-12 NOTE — Addendum Note (Signed)
Addended by: Alexas Basulto, Herbert SetaHEATHER R on: 10/12/2017 03:28 PM   Modules accepted: Orders

## 2017-10-12 NOTE — Progress Notes (Addendum)
   Subjective:    Patient ID: Traci Ray, female    DOB: 12/04/1955, 62 y.o.   MRN: [id number]  HPI  62 yo female in non acute distress with history of  2 week left great toe pain. "My nail started to come off ( for the 3rd time) and I cut it way back to so it would not catch on my sock or shoe." It has been red and tender and draining pus for  2 days.  Denies any fever or chills or numbess or tingling.   There were no vitals taken for this visit.  Review of Systems  Constitutional: Negative for chills and fever.  Skin: Positive for wound (draining pus, left great toe).       Objective:   Physical Exam  Constitutional: She is oriented to person, place, and time. She appears well-developed and well-nourished.  Eyes: Pupils are equal, round, and reactive to light. Conjunctivae and EOM are normal.  Neurological: She is alert and oriented to person, place, and time.  Skin: There is erythema (left great toe).  Psychiatric: She has a normal mood and affect. Her behavior is normal. Judgment and thought content normal.  Nursing note and vitals reviewed.    Left great toe is swollen with erythema, toenail is cut back where itis draining pus from underneath the toe nail. Patient had no dressing on the toe. Limited ROM secondary to swelling.<2sec CR     Assessment & Plan:  Left great toe infection. Reviewed with patient how to clean and dress toe, cleaned and neosporin placed on toe and  dressed in clinic. To take OTC Motrin 400mg -600mg  every 6 hours to help with pain and swelling. Referral made to podiatry. Given hand written prescription  Keflex 500mg  po TID x 7 days #21 no refills.  To clean toe twice daily, neosporin to the site and bandage to the area.  Patieent verbalizes understanding and has no questions at discharge.

## 2017-10-19 ENCOUNTER — Other Ambulatory Visit: Payer: Self-pay | Admitting: Student

## 2017-10-19 DIAGNOSIS — Z1231 Encounter for screening mammogram for malignant neoplasm of breast: Secondary | ICD-10-CM

## 2017-10-22 ENCOUNTER — Ambulatory Visit: Payer: Self-pay | Admitting: Podiatry

## 2017-10-24 ENCOUNTER — Other Ambulatory Visit: Payer: Self-pay | Admitting: Medical

## 2017-10-24 DIAGNOSIS — I10 Essential (primary) hypertension: Secondary | ICD-10-CM

## 2017-10-27 ENCOUNTER — Ambulatory Visit
Admission: RE | Admit: 2017-10-27 | Discharge: 2017-10-27 | Disposition: A | Discharge status: Home / Self Care | Payer: BLUE CROSS/BLUE SHIELD | Source: Ambulatory Visit | Attending: Student | Admitting: Student

## 2017-10-27 DIAGNOSIS — Z1231 Encounter for screening mammogram for malignant neoplasm of breast: Secondary | ICD-10-CM | POA: Insufficient documentation

## 2017-11-02 ENCOUNTER — Telehealth: Payer: Self-pay | Admitting: *Deleted

## 2017-11-05 ENCOUNTER — Ambulatory Visit (INDEPENDENT_AMBULATORY_CARE_PROVIDER_SITE_OTHER): Payer: BLUE CROSS/BLUE SHIELD | Admitting: Podiatry

## 2017-11-05 ENCOUNTER — Encounter: Payer: Self-pay | Admitting: Podiatry

## 2017-11-05 VITALS — BP 152/85 | HR 56

## 2017-11-05 DIAGNOSIS — G4733 Obstructive sleep apnea (adult) (pediatric): Secondary | ICD-10-CM | POA: Insufficient documentation

## 2017-11-05 DIAGNOSIS — L603 Nail dystrophy: Secondary | ICD-10-CM | POA: Diagnosis not present

## 2017-11-05 DIAGNOSIS — M216X9 Other acquired deformities of unspecified foot: Secondary | ICD-10-CM

## 2017-11-05 DIAGNOSIS — I10 Essential (primary) hypertension: Secondary | ICD-10-CM | POA: Insufficient documentation

## 2017-11-05 DIAGNOSIS — Q828 Other specified congenital malformations of skin: Secondary | ICD-10-CM | POA: Diagnosis not present

## 2017-11-06 NOTE — Progress Notes (Signed)
This patient presents to the office with chief complaint of her left big toenail keeps coming off.  She says she injures herself in 2016 and injures her left side.  She says she was examined but her toenail was never examined only her left great toe.  She says her nail came off and regrew.  It came off again and is now regrowing.  She says she has experienced redness and swelling and pain on occasion but today there are no symptoms.  She wishes to discuss this nail at this visit.  General Appearance  Alert, conversant and in no acute stress.  Vascular  Dorsalis pedis and posterior tibial  pulses are palpable  bilaterally.  Capillary return is within normal limits  bilaterally. Temperature is within normal limits  bilaterally.  Neurologic  Senn-Weinstein monofilament wire test within normal limits  bilaterally. Muscle power within normal limits bilaterally.  Nails Thick disfigured discolored nails with subungual debris  from hallux left great toenail.. No evidence of bacterial infection or drainage bilaterally. Nail is unattached to the nail bed.  Orthopedic  No limitations of motion of motion feet .  No crepitus or effusions noted.  No bony pathology or digital deformities noted.  Skin  normotropic skin .  Porokeratosis sub 4  B/L.  Nail dystrophy Porokeratosis.  IE  Explained this patient has injured nail bed leading to the nails falling off.  We plan on nail surgery in July for permanent nail removal surgery.  Told to get insoles for her porokeratosis. RTC nail surgery.   Helane Gunther DPM

## 2018-01-14 ENCOUNTER — Ambulatory Visit (INDEPENDENT_AMBULATORY_CARE_PROVIDER_SITE_OTHER): Payer: BLUE CROSS/BLUE SHIELD | Admitting: Podiatry

## 2018-01-14 ENCOUNTER — Encounter: Payer: Self-pay | Admitting: Podiatry

## 2018-01-14 DIAGNOSIS — L603 Nail dystrophy: Secondary | ICD-10-CM

## 2018-01-14 DIAGNOSIS — B351 Tinea unguium: Secondary | ICD-10-CM

## 2018-01-14 DIAGNOSIS — M79675 Pain in left toe(s): Secondary | ICD-10-CM

## 2018-01-14 NOTE — Progress Notes (Signed)
This patient presents to the office with chief complaint of her left big toenail keeps coming off.  She is scheduled for nail surgery for the permanent removal of the left great toe today.  She says she is not having any pain or discomfort or swelling due to this nail.    General Appearance  Alert, conversant and in no acute stress.  Vascular  Dorsalis pedis and posterior tibial  pulses are palpable  bilaterally.  Capillary return is within normal limits  bilaterally. Temperature is within normal limits  bilaterally.  Neurologic  Senn-Weinstein monofilament wire test within normal limits  bilaterally. Muscle power within normal limits bilaterally.  Nails Thick disfigured discolored nails with subungual debris  from hallux left great toenail.. No evidence of bacterial infection or drainage bilaterally. Nail is unattached to the nail bed.  Orthopedic  No limitations of motion of motion feet .  No crepitus or effusions noted.  No bony pathology or digital deformities noted.  Skin  normotropic skin .  Porokeratosis sub 4  B/L.  Nail dystrophy   Return office visit  Discuss the surgery with this patient and we decided to hold off on performing the surgery due to the fact the nail is pain free.  RTC prn.   Helane GuntherGregory Athira Janowicz DPM

## 2018-02-25 ENCOUNTER — Ambulatory Visit: Payer: BLUE CROSS/BLUE SHIELD | Admitting: Adult Health

## 2018-02-25 ENCOUNTER — Encounter: Payer: Self-pay | Admitting: Adult Health

## 2018-02-25 VITALS — BP 142/97 | HR 62 | Temp 98.0°F | Resp 16 | Ht 66.0 in | Wt 178.0 lb

## 2018-02-25 DIAGNOSIS — J3089 Other allergic rhinitis: Secondary | ICD-10-CM

## 2018-02-25 MED ORDER — FLUTICASONE PROPIONATE 50 MCG/ACT NA SUSP: 2.0000 | Freq: Every day | NASAL | 3 refills | Status: DC

## 2018-02-25 MED ORDER — LORATADINE 10 MG PO TABS: 10.0000 mg | ORAL_TABLET | Freq: Every day | ORAL | 3 refills | Status: DC

## 2018-02-25 NOTE — Progress Notes (Addendum)
Subjective:     Patient ID: Traci Ray, female   DOB: Mar 10, 1956, 62 y.o.   MRN: [id number]  HPI   Blood pressure (!) 142/97, pulse 62, temperature 98 F (36.7 C), resp. rate 16, height 5\' 6"  (1.676 m), weight 178 lb (80.7 kg), SpO2 97 %.  Patient is a 62 year old female in no acute distress who comes to the clinic and reports her allergies are flaring she has had watery eyes, pressure in bilateral ears, runny nose all time. She reports she has been out of her flonase nasal spray for two months. Post nasal drip.She reports she always has seasonal allergies that are worse this time of year.   She sees PCP at [facility] for blood pressure and primary care. No decongestants.  Discussed elevated reading at today's visit - she reports she has follow up with her PCP in around two weeks and will monitor. She reports this week has been stressful with classes starting back at [employer].  Denies any pain or pain with any radiation.  Patient  denies any fever, body aches,chills, rash, chest pain, shortness of breath, nausea, vomiting, or diarrhea.   Denies any recent hospitalizations or illness.  No LMP recorded. Patient is postmenopausal.  Review of Systems  Constitutional: Negative.  Negative for activity change, appetite change, chills, diaphoresis, fatigue, fever and unexpected weight change.  HENT: Positive for congestion, postnasal drip, rhinorrhea, sneezing and sore throat. Negative for dental problem, drooling, ear discharge, ear pain, facial swelling, hearing loss, mouth sores, nosebleeds, sinus pressure, sinus pain, tinnitus, trouble swallowing and voice change.        Ear fullness/ pressure bilaterally. Denies any pain or drainage bilaterally.   Eyes: Positive for discharge (bilateral watery itchijng eyes at times mostly when coming in from outside per patient) and itching. Negative for photophobia, pain, redness and visual disturbance.  Respiratory: Negative.   Cardiovascular:  Negative.   Gastrointestinal: Negative.   Endocrine: Negative.   Genitourinary: Negative.   Musculoskeletal: Negative.   Skin: Negative.   Allergic/Immunologic: Positive for environmental allergies and food allergies. Negative for immunocompromised state.  Neurological: Negative.   Hematological: Negative.   Psychiatric/Behavioral: Negative.        Objective:   Physical Exam  Constitutional: She is oriented to person, place, and time. She appears well-developed and well-nourished. She is active.  Non-toxic appearance. She does not have a sickly appearance. She does not appear ill. No distress.  HENT:  Head: Normocephalic and atraumatic.  Right Ear: Hearing, external ear and ear canal normal. No tenderness. Tympanic membrane is not perforated and not erythematous. A middle ear effusion is present.  Left Ear: Hearing, external ear and ear canal normal. No tenderness. Tympanic membrane is not perforated and not erythematous. A middle ear effusion is present.  Nose: Rhinorrhea present. No mucosal edema. Right sinus exhibits no maxillary sinus tenderness and no frontal sinus tenderness. Left sinus exhibits no maxillary sinus tenderness and no frontal sinus tenderness.  Mouth/Throat: Uvula is midline and mucous membranes are normal. No oral lesions. No uvula swelling. Posterior oropharyngeal erythema (mild ) present. No oropharyngeal exudate, posterior oropharyngeal edema or tonsillar abscesses. Tonsils are 0 on the right. Tonsils are 0 on the left.   Cobblestoning posterior pharynx; bilateral allergic shiners; bilateral TMs air fluid level clear; bilateral nasal turbinates mild edema erythema clear discharge;     Eyes: Pupils are equal, round, and reactive to light. Conjunctivae and EOM are normal. Right eye exhibits no discharge. Left eye  exhibits no discharge. No scleral icterus.  Neck: Normal range of motion. Neck supple. No JVD present. No tracheal deviation present.  Cardiovascular: Normal  rate, regular rhythm, normal heart sounds and intact distal pulses. Exam reveals no gallop and no friction rub.  No murmur heard. Pulmonary/Chest: Effort normal and breath sounds normal. No accessory muscle usage or stridor. No respiratory distress. She has no decreased breath sounds. She has no wheezes. She has no rales. She exhibits no tenderness.  Abdominal: Soft. Bowel sounds are normal.  Musculoskeletal: Normal range of motion.  Lymphadenopathy:       Head (right side): No submental, no submandibular, no tonsillar, no preauricular, no posterior auricular and no occipital adenopathy present.       Head (left side): No submental, no submandibular, no tonsillar, no preauricular, no posterior auricular and no occipital adenopathy present.    She has no cervical adenopathy.  Neurological: She is alert and oriented to person, place, and time. She has normal strength. She displays normal reflexes. No cranial nerve deficit. She exhibits normal muscle tone. Coordination normal.  Skin: Skin is warm, dry and intact. Capillary refill takes less than 2 seconds. No rash noted. She is not diaphoretic. No erythema. No pallor. Nails show no clubbing.  Psychiatric: She has a normal mood and affect. Her speech is normal and behavior is normal. Judgment and thought content normal. Cognition and memory are normal.  Vitals reviewed.      Assessment:      Plan:     Follow up with PCP for blood pressure and allergic rhinitis/ seasonal allergies within 1- 2 weeks from today's appointment.   Meds ordered this encounter  Medications  . loratadine (CLARITIN) 10 MG tablet    Sig: Take 1 tablet (10 mg total) by mouth daily.    Dispense:  30 tablet    Refill:  3    Patient does not want this filled until 03/05/18 and will pick up this day  . fluticasone (FLONASE) 50 MCG/ACT nasal spray    Sig: Place 2 sprays into both nostrils daily.    Dispense:  16 g    Refill:  3    Patient does not want this filled until  03/05/18 and will pick up this day  Avoid and decongestants. Avoid caffeine.   Advised patient call the office or your primary care doctor for an appointment if no improvement within 72 hours or if any symptoms change or worsen at any time  Advised ER or urgent Care if after hours or on weekend. Call 911 for emergency symptoms at any time.Patinet verbalized understanding of all instructions given/reviewed and treatment plan and has no further questions or concerns at this time.    Patient verbalized understanding of all instructions given and denies any further questions at this time.

## 2018-02-25 NOTE — Patient Instructions (Signed)
Loratadine tablets What is this medicine? LORATADINE (lor AT a deen) is an antihistamine. It helps to relieve sneezing, runny nose, and itchy, watery eyes. This medicine is used to treat the symptoms of allergies. It is also used to treat itchy skin rash and hives. This medicine may be used for other purposes; ask your health care provider or pharmacist if you have questions. COMMON BRAND NAME(S): Alavert, Allergy Relief, Claritin, Claritin Hives Relief, Clear-Atadine, QlearQuil All Day & All Night Allergy Relief, Tavist ND What should I tell my health care provider before I take this medicine? They need to know if you have any of these conditions: -asthma -kidney disease -liver disease -an unusual or allergic reaction to loratadine, other antihistamines, other medicines, foods, dyes, or preservatives -pregnant or trying to get pregnant -breast-feeding How should I use this medicine? Take this medicine by mouth with a glass of water. Follow the directions on the label. You may take this medicine with food or on an empty stomach. Take your medicine at regular intervals. Do not take your medicine more often than directed. Talk to your pediatrician regarding the use of this medicine in children. While this medicine may be used in children as young as 6 years for selected conditions, precautions do apply. Overdosage: If you think you have taken too much of this medicine contact a poison control center or emergency room at once. NOTE: This medicine is only for you. Do not share this medicine with others. What if I miss a dose? If you miss a dose, take it as soon as you can. If it is almost time for your next dose, take only that dose. Do not take double or extra doses. What may interact with this medicine? -other medicines for colds or allergies This list may not describe all possible interactions. Give your health care provider a list of all the medicines, herbs, non-prescription drugs, or dietary  supplements you use. Also tell them if you smoke, drink alcohol, or use illegal drugs. Some items may interact with your medicine. What should I watch for while using this medicine? Tell your doctor or healthcare professional if your symptoms do not start to get better or if they get worse. Your mouth may get dry. Chewing sugarless gum or sucking hard candy, and drinking plenty of water may help. Contact your doctor if the problem does not go away or is severe. You may get drowsy or dizzy. Do not drive, use machinery, or do anything that needs mental alertness until you know how this medicine affects you. Do not stand or sit up quickly, especially if you are an older patient. This reduces the risk of dizzy or fainting spells. What side effects may I notice from receiving this medicine? Side effects that you should report to your doctor or health care professional as soon as possible: -allergic reactions like skin rash, itching or hives, swelling of the face, lips, or tongue -breathing problems -unusually restless or nervous Side effects that usually do not require medical attention (report to your doctor or health care professional if they continue or are bothersome): -drowsiness -dry or irritated mouth or throat -headache This list may not describe all possible side effects. Call your doctor for medical advice about side effects. You may report side effects to FDA at 1-800-FDA-1088. Where should I keep my medicine? Keep out of the reach of children. Store at room temperature between 2 and 30 degrees C (36 and 86 degrees F). Protect from moisture. Throw away any  unused medicine after the expiration date. NOTE: This sheet is a summary. It may not cover all possible information. If you have questions about this medicine, talk to your doctor, pharmacist, or health care provider.  2018 Elsevier/Gold Standard (2007-12-20 17:17:24) Fluticasone nasal spray What is this medicine? FLUTICASONE (floo TIK  a sone) is a corticosteroid. This medicine is used to treat the symptoms of allergies like sneezing, itchy red eyes, and itchy, runny, or stuffy nose. This medicine is also used to treat nasal polyps. This medicine may be used for other purposes; ask your health care provider or pharmacist if you have questions. COMMON BRAND NAME(S): Flonase, Flonase Allergy Relief, Flonase Sensimist, Veramyst, XHANCE What should I tell my health care provider before I take this medicine? They need to know if you have any of these conditions: -cataracts -glaucoma -infection, like tuberculosis, herpes, or fungal infection -recent surgery on nose or sinuses -taking a corticosteroid by mouth -an unusual or allergic reaction to fluticasone, steroids, other medicines, foods, dyes, or preservatives -pregnant or trying to get pregnant -breast-feeding How should I use this medicine? This medicine is for use in the nose. Follow the directions on your product or prescription label. This medicine works best if used at regular intervals. Do not use more often than directed. Make sure that you are using your nasal spray correctly. After 6 months of daily use for allergies, talk to your doctor or health care professional before using it for a longer time. Ask your doctor or health care professional if you have any questions. Talk to your pediatrician regarding the use of this medicine in children. Special care may be needed. Some products have been used for allergies in children as young as 2 years. After 2 months of daily use without a prescription in a child, talk to your pediatrician before using it for a longer time. Use of this medicine for nasal polyps is not approved in children. Overdosage: If you think you have taken too much of this medicine contact a poison control center or emergency room at once. NOTE: This medicine is only for you. Do not share this medicine with others. What if I miss a dose? If you miss a dose,  use it as soon as you remember. If it is almost time for your next dose, use only that dose and continue with your regular schedule. Do not use double or extra doses. What may interact with this medicine? -certain antibiotics like clarithromycin and telithromycin -certain medicines for fungal infections like ketoconazole, itraconazole, and voriconazole -conivaptan -nefazodone -some medicines for HIV -vaccines This list may not describe all possible interactions. Give your health care provider a list of all the medicines, herbs, non-prescription drugs, or dietary supplements you use. Also tell them if you smoke, drink alcohol, or use illegal drugs. Some items may interact with your medicine. What should I watch for while using this medicine? Visit your doctor or health care professional for regular checks on your progress. Some symptoms may improve within 12 hours after starting use. Check with your doctor or health care professional if there is no improvement in your symptoms after 3 weeks of use. This medicine may increase your risk of getting an infection. Tell your doctor or health care professional if you are around anyone with measles or chickenpox, or if you develop sores or blisters that do not heal properly. What side effects may I notice from receiving this medicine? Side effects that you should report to your doctor or health  care professional as soon as possible: -allergic reactions like skin rash, itching or hives, swelling of the face, lips, or tongue -changes in vision -crusting or sores in the nose -nosebleed -signs and symptoms of infection like fever or chills; cough; sore throat -white patches or sores in the mouth or nose Side effects that usually do not require medical attention (report to your doctor or health care professional if they continue or are bothersome): -burning or irritation inside the nose or throat -cough -headache -unusual taste or smell This list may not  describe all possible side effects. Call your doctor for medical advice about side effects. You may report side effects to FDA at 1-800-FDA-1088. Where should I keep my medicine? Keep out of the reach of children. Store at room temperature between 15 and 30 degrees C (59 and 86 degrees F). Avoid exposure to extreme heat, cold, or light. Throw away any unused medicine after the expiration date. NOTE: This sheet is a summary. It may not cover all possible information. If you have questions about this medicine, talk to your doctor, pharmacist, or health care provider.  2018 Elsevier/Gold Standard (2016-03-28 14:23:12) Allergic Rhinitis, Adult Allergic rhinitis is an allergic reaction that affects the mucous membrane inside the nose. It causes sneezing, a runny or stuffy nose, and the feeling of mucus going down the back of the throat (postnasal drip). Allergic rhinitis can be mild to severe. There are two types of allergic rhinitis:  Seasonal. This type is also called hay fever. It happens only during certain seasons.  Perennial. This type can happen at any time of the year.  What are the causes? This condition happens when the body's defense system (immune system) responds to certain harmless substances called allergens as though they were germs.  Seasonal allergic rhinitis is triggered by pollen, which can come from grasses, trees, and weeds. Perennial allergic rhinitis may be caused by:  House dust mites.  Pet dander.  Mold spores.  What are the signs or symptoms? Symptoms of this condition include:  Sneezing.  Runny or stuffy nose (nasal congestion).  Postnasal drip.  Itchy nose.  Tearing of the eyes.  Trouble sleeping.  Daytime sleepiness.  How is this diagnosed? This condition may be diagnosed based on:  Your medical history.  A physical exam.  Tests to check for related conditions, such as: ? Asthma. ? Pink eye. ? Ear infection. ? Upper respiratory  infection.  Tests to find out which allergens trigger your symptoms. These may include skin or blood tests.  How is this treated? There is no cure for this condition, but treatment can help control symptoms. Treatment may include:  Taking medicines that block allergy symptoms, such as antihistamines. Medicine may be given as a shot, nasal spray, or pill.  Avoiding the allergen.  Desensitization. This treatment involves getting ongoing shots until your body becomes less sensitive to the allergen. This treatment may be done if other treatments do not help.  If taking medicine and avoiding the allergen does not work, new, stronger medicines may be prescribed.  Follow these instructions at home:  Find out what you are allergic to. Common allergens include smoke, dust, and pollen.  Avoid the things you are allergic to. These are some things you can do to help avoid allergens: ? Replace carpet with wood, tile, or vinyl flooring. Carpet can trap dander and dust. ? Do not smoke. Do not allow smoking in your home. ? Change your heating and air conditioning filter at  least once a month. ? During allergy season:  Keep windows closed as much as possible.  Plan outdoor activities when pollen counts are lowest. This is usually during the evening hours.  When coming indoors, change clothing and shower before sitting on furniture or bedding.  Take over-the-counter and prescription medicines only as told by your health care provider.  Keep all follow-up visits as told by your health care provider. This is important. Contact a health care provider if:  You have a fever.  You develop a persistent cough.  You make whistling sounds when you breathe (you wheeze).  Your symptoms interfere with your normal daily activities. Get help right away if:  You have shortness of breath. Summary  This condition can be managed by taking medicines as directed and avoiding allergens.  Contact your  health care provider if you develop a persistent cough or fever.  During allergy season, keep windows closed as much as possible. This information is not intended to replace advice given to you by your health care provider. Make sure you discuss any questions you have with your health care provider. Document Released: 03/11/2001 Document Revised: 07/24/2016 Document Reviewed: 07/24/2016 Elsevier Interactive Patient Education  [employer].

## 2018-03-25 ENCOUNTER — Ambulatory Visit: Payer: BLUE CROSS/BLUE SHIELD | Admitting: Podiatry

## 2018-04-29 ENCOUNTER — Ambulatory Visit: Payer: BLUE CROSS/BLUE SHIELD | Admitting: Adult Health

## 2018-04-29 ENCOUNTER — Encounter: Payer: Self-pay | Admitting: Adult Health

## 2018-04-29 VITALS — BP 143/82 | HR 66 | Temp 98.2°F | Resp 16 | Ht 65.0 in | Wt 182.0 lb

## 2018-04-29 DIAGNOSIS — I10 Essential (primary) hypertension: Secondary | ICD-10-CM

## 2018-04-29 DIAGNOSIS — J302 Other seasonal allergic rhinitis: Secondary | ICD-10-CM

## 2018-04-29 MED ORDER — FLUTICASONE PROPIONATE 50 MCG/ACT NA SUSP: 2.0000 | Freq: Every day | NASAL | 3 refills | Status: AC

## 2018-04-29 MED ORDER — LORATADINE 10 MG PO TABS: 10.0000 mg | ORAL_TABLET | Freq: Every day | ORAL | 3 refills | Status: AC

## 2018-04-29 NOTE — Progress Notes (Addendum)
Subjective:     Patient ID: Traci Ray, female   DOB: [birth year], 62 y.o.   MRN: [id number]  Blood pressure (!) 143/82, pulse 66, temperature 98.2 F (36.8 C), resp. rate 16, height 5\' 5"  (1.651 m), weight 182 lb (82.6 kg), SpO2 96 %. HPI Patient is a 62 year old female in no acute distress who comes to the office for a blood pressure check she reports she has an appointment with her primary care doctor tomorrow 11/1 and would like to take reading with her. She has not been taking blood pressure readings at home.   Stress at work.   Patient  denies any fever, body aches,chills, rash, chest pain, shortness of breath, nausea, vomiting, or diarrhea.   She reports she is having ear fullness and ear pressure intermittent over the past two weeks. Rhinorrhea at times. She requests refills for the above over the counter medications - she has not been using these medications for last few months.   Review of Systems  Constitutional: Negative.   HENT: Positive for congestion and rhinorrhea. Negative for dental problem, drooling, ear discharge, ear pain, facial swelling, hearing loss, mouth sores, nosebleeds, postnasal drip, sinus pressure, sinus pain, sneezing, sore throat, tinnitus, trouble swallowing and voice change.   Eyes: Negative.   Respiratory: Negative.   Cardiovascular: Negative.   Gastrointestinal: Negative.   Endocrine: Negative.   Genitourinary: Negative.   Musculoskeletal: Positive for arthralgias (" always" ). Negative for back pain, gait problem, joint swelling, myalgias, neck pain and neck stiffness.  Skin: Negative.   Allergic/Immunologic: Negative.   Neurological: Negative.   Hematological: Negative.   Psychiatric/Behavioral: Negative.        Objective:   Physical Exam  Constitutional: She is oriented to person, place, and time. Vital signs are normal. She appears well-developed and well-nourished. She is active. No distress.  HENT:  Head: Normocephalic and  atraumatic.  Right Ear: Hearing, external ear and ear canal normal. No drainage, swelling or tenderness. No foreign bodies. A middle ear effusion is present.  Left Ear: Hearing, external ear and ear canal normal. No drainage, swelling or tenderness. No foreign bodies. A middle ear effusion is present.  Nose: Mucosal edema and rhinorrhea present. Right sinus exhibits no maxillary sinus tenderness and no frontal sinus tenderness. Left sinus exhibits no maxillary sinus tenderness and no frontal sinus tenderness.  Mouth/Throat: Uvula is midline, oropharynx is clear and moist and mucous membranes are normal. No oropharyngeal exudate. Tonsils are 0 on the right. Tonsils are 0 on the left.  Eyes: Pupils are equal, round, and reactive to light. Conjunctivae, EOM and lids are normal. Lids are everted and swept, no foreign bodies found. Right eye exhibits no discharge. Left eye exhibits no discharge. No scleral icterus.  Neck: Trachea normal, normal range of motion, full passive range of motion without pain and phonation normal. Neck supple. No Brudzinski's sign noted.  Cardiovascular: Normal rate, regular rhythm, normal heart sounds, intact distal pulses and normal pulses. Exam reveals no gallop and no friction rub.  No murmur heard. Pulmonary/Chest: Effort normal and breath sounds normal. No stridor. No respiratory distress. She has no wheezes. She has no rales. She exhibits no tenderness.  Abdominal: Soft. Bowel sounds are normal. She exhibits no distension and no mass. There is no tenderness. There is no rebound and no guarding. No hernia.  Musculoskeletal: Normal range of motion.  Lymphadenopathy:       Head (right side): No submental, no submandibular, no tonsillar, no preauricular, no  posterior auricular and no occipital adenopathy present.       Head (left side): No submental, no submandibular, no tonsillar, no preauricular, no posterior auricular and no occipital adenopathy present.    She has no  cervical adenopathy.  Neurological: She is alert and oriented to person, place, and time. She has normal strength.  Skin: Skin is warm, dry and intact. Capillary refill takes less than 2 seconds. She is not diaphoretic. Nails show no clubbing.  Psychiatric: She has a normal mood and affect. Her behavior is normal. Judgment and thought content normal.  Vitals reviewed.      Assessment:    Hypertension, unspecified type  Seasonal allergic rhinitis, unspecified trigger      Plan:     Meds ordered this encounter  Medications  . fluticasone (FLONASE) 50 MCG/ACT nasal spray    Sig: Place 2 sprays into both nostrils daily.    Dispense:  16 g    Refill:  3    Patient does not want this filled until 03/05/18 and will pick up this day  . loratadine (CLARITIN) 10 MG tablet    Sig: Take 1 tablet (10 mg total) by mouth daily.    Dispense:  30 tablet    Refill:  3      She requests refills for the above over the counter medications - she has not been using these medications for last few months.   Keep appointment with your primary care MD on 04/30/2018. Advised patient call the office or your primary care doctor for an appointment if no improvement within 72 hours or if any symptoms change or worsen at any time  Advised ER or urgent Care if after hours or on weekend. Call 911 for emergency symptoms at any time.Patinet verbalized understanding of all instructions given/reviewed and treatment plan and has no further questions or concerns at this time.

## 2018-04-29 NOTE — Patient Instructions (Signed)
Eustachian Tube Dysfunction The eustachian tube connects the middle ear to the back of the nose. It regulates air pressure in the middle ear by allowing air to move between the ear and nose. It also helps to drain fluid from the middle ear space. When the eustachian tube does not function properly, air pressure, fluid, or both can build up in the middle ear. Eustachian tube dysfunction can affect one or both ears. What are the causes? This condition happens when the eustachian tube becomes blocked or cannot open normally. This may result from:  Ear infections.  Colds and other upper respiratory infections.  Allergies.  Irritation, such as from cigarette smoke or acid from the stomach coming up into the esophagus (gastroesophageal reflux).  Sudden changes in air pressure, such as from descending in an airplane.  Abnormal growths in the nose or throat, such as nasal polyps, tumors, or enlarged tissue at the back of the throat (adenoids).  What increases the risk? This condition may be more likely to develop in people who smoke and people who are overweight. Eustachian tube dysfunction may also be more likely to develop in children, especially children who have:  Certain birth defects of the mouth, such as cleft palate.  Large tonsils and adenoids.  What are the signs or symptoms? Symptoms of this condition may include:  A feeling of fullness in the ear.  Ear pain.  Clicking or popping noises in the ear.  Ringing in the ear.  Hearing loss.  Loss of balance.  Symptoms may get worse when the air pressure around you changes, such as when you travel to an area of high elevation or fly on an airplane. How is this diagnosed? This condition may be diagnosed based on:  Your symptoms.  A physical exam of your ear, nose, and throat.  Tests, such as those that measure: ? The movement of your eardrum (tympanogram). ? Your hearing (audiometry).  How is this treated? Treatment  depends on the cause and severity of your condition. If your symptoms are mild, you may be able to relieve your symptoms by moving air into ("popping") your ears. If you have symptoms of fluid in your ears, treatment may include:  Decongestants.  Antihistamines.  Nasal sprays or ear drops that contain medicines that reduce swelling (steroids).  In some cases, you may need to have a procedure to drain the fluid in your eardrum (myringotomy). In this procedure, a small tube is placed in the eardrum to:  Drain the fluid.  Restore the air in the middle ear space.  Follow these instructions at home:  Take over-the-counter and prescription medicines only as told by your health care provider.  Use techniques to help pop your ears as recommended by your health care provider. These may include: ? Chewing gum. ? Yawning. ? Frequent, forceful swallowing. ? Closing your mouth, holding your nose closed, and gently blowing as if you are trying to blow air out of your nose.  Do not do any of the following until your health care provider approves: ? Travel to high altitudes. ? Fly in airplanes. ? Work in a pressurized cabin or room. ? Scuba dive.  Keep your ears dry. Dry your ears completely after showering or bathing.  Do not smoke.  Keep all follow-up visits as told by your health care provider. This is important. Contact a health care provider if:  Your symptoms do not go away after treatment.  Your symptoms come back after treatment.  You are   unable to pop your ears.  You have: ? A fever. ? Pain in your ear. ? Pain in your head or neck. ? Fluid draining from your ear.  Your hearing suddenly changes.  You become very dizzy.  You lose your balance. This information is not intended to replace advice given to you by your health care provider. Make sure you discuss any questions you have with your health care provider. Document Released: 07/13/2015 Document Revised: 11/22/2015  Document Reviewed: 07/05/2014 Elsevier Interactive Patient Education  2018 [employer]. Hypertension Hypertension, commonly called high blood pressure, is when the force of blood pumping through the arteries is too strong. The arteries are the blood vessels that carry blood from the heart throughout the body. Hypertension forces the heart to work harder to pump blood and may cause arteries to become narrow or stiff. Having untreated or uncontrolled hypertension can cause heart attacks, strokes, kidney disease, and other problems. A blood pressure reading consists of a higher number over a lower number. Ideally, your blood pressure should be below 120/80. The first ("top") number is called the systolic pressure. It is a measure of the pressure in your arteries as your heart beats. The second ("bottom") number is called the diastolic pressure. It is a measure of the pressure in your arteries as the heart relaxes. What are the causes? The cause of this condition is not known. What increases the risk? Some risk factors for high blood pressure are under your control. Others are not. Factors you can change  Smoking.  Having type 2 diabetes mellitus, high cholesterol, or both.  Not getting enough exercise or physical activity.  Being overweight.  Having too much fat, sugar, calories, or salt (sodium) in your diet.  Drinking too much alcohol. Factors that are difficult or impossible to change  Having chronic kidney disease.  Having a family history of high blood pressure.  Age. Risk increases with age.  Race. You may be at higher risk if you are African-American.  Gender. Men are at higher risk than women before age 9. After age 75, women are at higher risk than men.  Having obstructive sleep apnea.  Stress. What are the signs or symptoms? Extremely high blood pressure (hypertensive crisis) may cause:  Headache.  Anxiety.  Shortness of breath.  Nosebleed.  Nausea and  vomiting.  Severe chest pain.  Jerky movements you cannot control (seizures).  How is this diagnosed? This condition is diagnosed by measuring your blood pressure while you are seated, with your arm resting on a surface. The cuff of the blood pressure monitor will be placed directly against the skin of your upper arm at the level of your heart. It should be measured at least twice using the same arm. Certain conditions can cause a difference in blood pressure between your right and left arms. Certain factors can cause blood pressure readings to be lower or higher than normal (elevated) for a short period of time:  When your blood pressure is higher when you are in a health care provider's office than when you are at home, this is called white coat hypertension. Most people with this condition do not need medicines.  When your blood pressure is higher at home than when you are in a health care provider's office, this is called masked hypertension. Most people with this condition may need medicines to control blood pressure.  If you have a high blood pressure reading during one visit or you have normal blood pressure with other  risk factors:  You may be asked to return on a different day to have your blood pressure checked again.  You may be asked to monitor your blood pressure at home for 1 week or longer.  If you are diagnosed with hypertension, you may have other blood or imaging tests to help your health care provider understand your overall risk for other conditions. How is this treated? This condition is treated by making healthy lifestyle changes, such as eating healthy foods, exercising more, and reducing your alcohol intake. Your health care provider may prescribe medicine if lifestyle changes are not enough to get your blood pressure under control, and if:  Your systolic blood pressure is above 130.  Your diastolic blood pressure is above 80.  Your personal target blood pressure  may vary depending on your medical conditions, your age, and other factors. Follow these instructions at home: Eating and drinking  Eat a diet that is high in fiber and potassium, and low in sodium, added sugar, and fat. An example eating plan is called the DASH (Dietary Approaches to Stop Hypertension) diet. To eat this way: ? Eat plenty of fresh fruits and vegetables. Try to fill half of your plate at each meal with fruits and vegetables. ? Eat whole grains, such as whole wheat pasta, brown rice, or whole grain bread. Fill about one quarter of your plate with whole grains. ? Eat or drink low-fat dairy products, such as skim milk or low-fat yogurt. ? Avoid fatty cuts of meat, processed or cured meats, and poultry with skin. Fill about one quarter of your plate with lean proteins, such as fish, chicken without skin, beans, eggs, and tofu. ? Avoid premade and processed foods. These tend to be higher in sodium, added sugar, and fat.  Reduce your daily sodium intake. Most people with hypertension should eat less than 1,500 mg of sodium a day.  Limit alcohol intake to no more than 1 drink a day for nonpregnant women and 2 drinks a day for men. One drink equals 12 oz of beer, 5 oz of wine, or 1 oz of hard liquor. Lifestyle  Work with your health care provider to maintain a healthy body weight or to lose weight. Ask what an ideal weight is for you.  Get at least 30 minutes of exercise that causes your heart to beat faster (aerobic exercise) most days of the week. Activities may include walking, swimming, or biking.  Include exercise to strengthen your muscles (resistance exercise), such as pilates or lifting weights, as part of your weekly exercise routine. Try to do these types of exercises for 30 minutes at least 3 days a week.  Do not use any products that contain nicotine or tobacco, such as cigarettes and e-cigarettes. If you need help quitting, ask your health care provider.  Monitor your  blood pressure at home as told by your health care provider.  Keep all follow-up visits as told by your health care provider. This is important. Medicines  Take over-the-counter and prescription medicines only as told by your health care provider. Follow directions carefully. Blood pressure medicines must be taken as prescribed.  Do not skip doses of blood pressure medicine. Doing this puts you at risk for problems and can make the medicine less effective.  Ask your health care provider about side effects or reactions to medicines that you should watch for. Contact a health care provider if:  You think you are having a reaction to a medicine you are taking.  You have headaches that keep coming back (recurring).  You feel dizzy.  You have swelling in your ankles.  You have trouble with your vision. Get help right away if:  You develop a severe headache or confusion.  You have unusual weakness or numbness.  You feel faint.  You have severe pain in your chest or abdomen.  You vomit repeatedly.  You have trouble breathing. Summary  Hypertension is when the force of blood pumping through your arteries is too strong. If this condition is not controlled, it may put you at risk for serious complications.  Your personal target blood pressure may vary depending on your medical conditions, your age, and other factors. For most people, a normal blood pressure is less than 120/80.  Hypertension is treated with lifestyle changes, medicines, or a combination of both. Lifestyle changes include weight loss, eating a healthy, low-sodium diet, exercising more, and limiting alcohol. This information is not intended to replace advice given to you by your health care provider. Make sure you discuss any questions you have with your health care provider. Document Released: 06/16/2005 Document Revised: 05/14/2016 Document Reviewed: 05/14/2016 Elsevier Interactive Patient Education  [employer].

## 2018-05-14 ENCOUNTER — Ambulatory Visit: Payer: BLUE CROSS/BLUE SHIELD | Admitting: Podiatry

## 2018-05-17 ENCOUNTER — Encounter: Payer: Self-pay | Admitting: Medical

## 2018-05-17 ENCOUNTER — Ambulatory Visit: Payer: BLUE CROSS/BLUE SHIELD | Admitting: Medical

## 2018-05-17 VITALS — BP 150/84 | HR 54 | Temp 97.4°F | Resp 16 | Wt 187.4 lb

## 2018-05-17 DIAGNOSIS — I10 Essential (primary) hypertension: Secondary | ICD-10-CM

## 2018-05-17 NOTE — Progress Notes (Signed)
[facility]  [facility]. Looking for dates of original diagnosis for hypertension and Sleep Apnea. I did not diagnosis theres conditions in the patient. From notes it looks like [facility] saw patient for these conditions,, she then recalls that is where she used to go.  She will follow up with them.

## 2018-05-21 ENCOUNTER — Ambulatory Visit: Payer: BLUE CROSS/BLUE SHIELD | Admitting: Podiatry

## 2018-08-27 ENCOUNTER — Ambulatory Visit: Payer: BLUE CROSS/BLUE SHIELD | Admitting: Nurse Practitioner

## 2018-08-27 ENCOUNTER — Encounter: Payer: Self-pay | Admitting: Nurse Practitioner

## 2018-08-27 ENCOUNTER — Other Ambulatory Visit: Payer: Self-pay

## 2018-08-27 VITALS — BP 139/86 | HR 55 | Temp 98.0°F | Resp 16 | Wt 182.0 lb

## 2018-08-27 DIAGNOSIS — S76211A Strain of adductor muscle, fascia and tendon of right thigh, initial encounter: Secondary | ICD-10-CM

## 2018-08-27 DIAGNOSIS — M5440 Lumbago with sciatica, unspecified side: Secondary | ICD-10-CM

## 2018-08-27 MED ORDER — PREDNISONE 20 MG PO TABS: ORAL_TABLET | ORAL | 0 refills | Status: AC

## 2018-08-27 NOTE — Patient Instructions (Addendum)
Take all of prednisone; then resume your Lodine (Etodolac) as directed Continue aspercreme as directed Start using heat to low back and groin and start stretching Printed material on groin strain given Encouraged patient to call the office or primary care doctor for an appointment if no improvement in symptoms or if symptoms change or worsen after 72 hours of planned treatment. Patient verbalized understanding of all instructions given/reviewed and has no further questions or concerns at this time.

## 2018-08-27 NOTE — Progress Notes (Signed)
   Subjective:    Patient ID: Jonel Whipp, female    DOB: [birth year], 63 y.o.   MRN: [id number]  HPI Maebell comes to the [facility] wellness [facility] today with complaint of right groin pain x 2 weeks.  She reports this pain has caused her to have difficulty walking the pain radiates down her leg.  He also complains of right flank low/back pain which started before her concern today.  She admits this happened when she was stepping forward and she felt a pull in her leg. has used ice pack with little to no relief. Denies numbness but reports tingling to right lower inner part of leg. Denies any urinary frequency, odor, suprapubic pain, or dysuria.     Review of Systems  Musculoskeletal: Positive for back pain, gait problem and myalgias.  Skin: Negative for pallor and wound.       Objective:   Physical Exam Constitutional:      Appearance: Normal appearance.  HENT:     Head: Normocephalic and atraumatic.  Musculoskeletal:        General: No swelling or deformity.     Comments: Tenderness to the lower right LS region and to the right adductor muscle Full range of motion but pain elicited with hyperextension of the right leg.  Skin:    General: Skin is warm and dry.  Neurological:     General: No focal deficit present.     Mental Status: She is alert and oriented to person, place, and time.     Motor: No weakness.     Gait: Gait abnormal.     Comments: Slightly antalgic gait, but get good balance with passive range of motion noted during exam when standing  Psychiatric:        Mood and Affect: Mood normal.           Assessment & Plan:

## 2018-09-01 ENCOUNTER — Encounter: Payer: Self-pay | Admitting: Medical

## 2018-09-01 ENCOUNTER — Other Ambulatory Visit: Payer: Self-pay

## 2018-09-01 ENCOUNTER — Ambulatory Visit: Payer: BLUE CROSS/BLUE SHIELD | Admitting: Medical

## 2018-09-01 VITALS — BP 144/76 | HR 54 | Temp 98.1°F | Resp 16 | Ht 66.0 in | Wt 182.0 lb

## 2018-09-01 DIAGNOSIS — S76211D Strain of adductor muscle, fascia and tendon of right thigh, subsequent encounter: Secondary | ICD-10-CM

## 2018-09-01 DIAGNOSIS — M545 Low back pain, unspecified: Secondary | ICD-10-CM

## 2018-09-01 MED ORDER — CYCLOBENZAPRINE HCL 5 MG PO TABS: 5.0000 mg | ORAL_TABLET | Freq: Every day | ORAL | 0 refills | Status: AC

## 2018-09-01 NOTE — Progress Notes (Signed)
Subjective:    Patient ID: Traci Ray, female    DOB: 07/14/1955, 63 y.o.   MRN: [id number]  HPI 63 yo female in non acute distress.  Felt pull Felt snap 10+/10(felt as if she stepped wrong) in right groin 08/13/2018, difficulty walking approximately 4 days later. Difficulty getting leg into car.  Finished Prednisone yesterday.Felt better.Stopped Lodine . Wanted to know if she can start back taking Lodine.  Lifted Tea Barrel full of tea on Monday, felt like it strained in right groins again. Now with back pain right, can get in her car, but getting out of the car is painful  feels back and groin pain  Again, but not as bad.Pain and warmth resolved. Rates  Pain 2/10. Now it is coming and going, not constant pain. No radiating , denies tingling but decreased sensation now.Denis weakness in right leg/ groin area. 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  Works  2 jobs at [employer], [employer]  During th day and the second job [employer], standing a lot at [employer].    Blood pressure (!) 144/76, pulse (!) 54, temperature 98.1 F (36.7 C), resp. rate 16, height 5\' 6"  (1.676 m), weight 182 lb (82.6 kg), SpO2 97 %. Allergies  Allergen Reactions  . Bee Venom Anaphylaxis  . Shellfish Allergy Anaphylaxis  . Poison Oak Extract Itching  . Tramadol Nausea And Vomiting     Review of Systems  Constitutional: Negative for chills and fever.  Genitourinary: Negative for dysuria, frequency, hematuria and urgency.  Musculoskeletal: Positive for back pain (right lower on the side) and gait problem (at times it causes pain).  Skin: Negative for rash.  Neurological: Negative for dizziness, syncope and light-headedness.           Objective:   Physical Exam Vitals signs and nursing note reviewed.  Constitutional:      Appearance: Normal appearance. She is obese.  HENT:     Head: Normocephalic and atraumatic.     Mouth/Throat:     Mouth: Mucous membranes are moist.  Eyes:   Extraocular Movements: Extraocular movements intact.     Conjunctiva/sclera: Conjunctivae normal.     Pupils: Pupils are equal, round, and reactive to light.  Neck:     Musculoskeletal: Normal range of motion.  Pulmonary:     Effort: Pulmonary effort is normal.  Musculoskeletal:        General: Tenderness and signs of injury present. No swelling or deformity.     Right lower leg: Edema present.     Left lower leg: No edema.  Skin:    General: Skin is warm and dry.     Capillary Refill: Capillary refill takes less than 2 seconds.  Neurological:     Mental Status: She is alert and oriented to person, place, and time.  Psychiatric:        Mood and Affect: Mood normal.        Behavior: Behavior normal.        Thought Content: Thought content normal.        Judgment: Judgment normal.       Pain squatting and coming back up but not squating on downward movement .  Can Extend and cross leg over,but pain occures on flexion of the hip. No pain on internal and external rotation. Pain on flexion a hip, no pain with extention of hip. No pain on flexion at there right knee. Patient denies bruising or rash on skin.   Full weight bearing ,  walks with purchase as she leaves clinic. Rate of speed in walk wnl. Assessment & Plan:  Muscle Strain right groin Meds ordered this encounter  Medications  . cyclobenzaprine (FLEXERIL) 5 MG tablet    Sig: Take 1 tablet (5 mg total) by mouth at bedtime.    Dispense:  20 tablet    Refill:  0   ICE / heat  To the right  groin area. Restart Lodine as prescribed.  Asked for work not to [employer] due to pain- Given x 2 days..Recommend patient follow up with [facility]  1:30 -7pm , patient verbalizes understanding and has no questions at discharge.

## 2018-09-01 NOTE — Patient Instructions (Addendum)
Acute Back Pain, Adult Acute back pain is sudden and usually short-lived. It is often caused by an injury to the muscles and tissues in the back. The injury may result from:  A muscle or ligament getting overstretched or torn (strained). Ligaments are tissues that connect bones to each other. Lifting something improperly can cause a back strain.  Wear and tear (degeneration) of the spinal disks. Spinal disks are circular tissue that provides cushioning between the bones of the spine (vertebrae).  Twisting motions, such as while playing sports or doing yard work.  A hit to the back.  Arthritis. You may have a physical exam, lab tests, and imaging tests to find the cause of your pain. Acute back pain usually goes away with rest and home care. Follow these instructions at home: Managing pain, stiffness, and swelling  Take over-the-counter and prescription medicines only as told by your health care provider.  Your health care provider may recommend applying ice during the first 24-48 hours after your pain starts. To do this: ? Put ice in a plastic bag. ? Place a towel between your skin and the bag. ? Leave the ice on for 20 minutes, 2-3 times a day.  If directed, apply heat to the affected area as often as told by your health care provider. Use the heat source that your health care provider recommends, such as a moist heat pack or a heating pad. ? Place a towel between your skin and the heat source. ? Leave the heat on for 20-30 minutes. ? Remove the heat if your skin turns bright red. This is especially important if you are unable to feel pain, heat, or cold. You have a greater risk of getting burned. Activity   Do not stay in bed. Staying in bed for more than 1-2 days can delay your recovery.  Sit up and stand up straight. Avoid leaning forward when you sit, or hunching over when you stand. ? If you work at a desk, sit close to it so you do not need to lean over. Keep your chin tucked  in. Keep your neck drawn back, and keep your elbows bent at a right angle. Your arms should look like the letter "L." ? Sit high and close to the steering wheel when you drive. Add lower back (lumbar) support to your car seat, if needed.  Take short walks on even surfaces as soon as you are able. Try to increase the length of time you walk each day.  Do not sit, drive, or stand in one place for more than 30 minutes at a time. Sitting or standing for long periods of time can put stress on your back.  Do not drive or use heavy machinery while taking prescription pain medicine.  Use proper lifting techniques. When you bend and lift, use positions that put less stress on your back: ? Bend your knees. ? Keep the load close to your body. ? Avoid twisting.  Exercise regularly as told by your health care provider. Exercising helps your back heal faster and helps prevent back injuries by keeping muscles strong and flexible.  Work with a physical therapist to make a safe exercise program, as recommended by your health care provider. Do any exercises as told by your physical therapist. Lifestyle  Maintain a healthy weight. Extra weight puts stress on your back and makes it difficult to have good posture.  Avoid activities or situations that make you feel anxious or stressed. Stress and anxiety increase muscle   tension and can make back pain worse. Learn ways to manage anxiety and stress, such as through exercise. General instructions  Sleep on a firm mattress in a comfortable position. Try lying on your side with your knees slightly bent. If you lie on your back, put a pillow under your knees.  Follow your treatment plan as told by your health care provider. This may include: ? Cognitive or behavioral therapy. ? Acupuncture or massage therapy. ? Meditation or yoga. Contact a health care provider if:  You have pain that is not relieved with rest or medicine.  You have increasing pain going down  into your legs or buttocks.  Your pain does not improve after 2 weeks.  You have pain at night.  You lose weight without trying.  You have a fever or chills. Get help right away if:  You develop new bowel or bladder control problems.  You have unusual weakness or numbness in your arms or legs.  You develop nausea or vomiting.  You develop abdominal pain.  You feel faint. Summary  Acute back pain is sudden and usually short-lived.  Use proper lifting techniques. When you bend and lift, use positions that put less stress on your back.  Take over-the-counter and prescription medicines and apply heat or ice as directed by your health care provider. This information is not intended to replace advice given to you by your health care provider. Make sure you discuss any questions you have with your health care provider. Document Released: 06/16/2005 Document Revised: 01/21/2018 Document Reviewed: 01/28/2017 Elsevier Interactive Patient Education  2019 [employer] Adductor Muscle Strain  An adductor muscle strain, also called a groin strain or pull, is an injury to the muscles or tendons on the upper, inner part of the thigh. These muscles are called the adductor muscles or groin muscles. They are responsible for moving the legs across the body or pulling the legs together. A muscle strain occurs when a muscle is overstretched and some muscle fibers are torn. An adductor muscle strain can range from mild to severe, depending on how many muscle fibers are affected and whether the muscle fibers are partially or completely torn. What are the causes? Adductor muscle strains usually occur during exercise or while participating in sports. The injury often happens when a sudden, violent force is placed on a muscle, stretching the muscle too far. A strain is more likely to happen when your muscles are not warmed up or if you are not properly conditioned. This injury may be caused  by:  Stretching the adductor muscles too far or too suddenly, often during side-to-side motion with a sudden change in direction.  Putting repeated stress on the adductor muscles over a long period of time.  Performing vigorous activity without properly stretching the adductor muscles beforehand. What are the signs or symptoms? Symptoms of this condition include:  Pain and tenderness in the groin area. This begins as sharp pain and persists as a dull ache.  A popping or snapping feeling when the injury occurs (for severe strains).  Swelling or bruising.  Muscle spasms.  Weakness in the leg.  Stiffness in the groin area with decreased ability to move the affected muscles. How is this diagnosed? This condition may be diagnosed based on:  Your symptoms and a description of how the injury occurred.  A physical exam.  Imaging tests, such as: ? X-rays. These are sometimes needed to rule out a broken bone or cartilage problems. ? An ultrasound, CT  scan, or MRI. These may be done if your health care provider suspects a complete muscle tear or needs to check for other injuries. How is this treated? An adductor strain will often heal on its own. If needed, this condition may be treated with:  PRICE therapy. PRICE stands for protection of the injured area, rest, ice, pressure (compression), and elevation.  Medicines to help manage pain and swelling (anti-inflammatory medicines).  Crutches. You may be directed to use these for the first few days to minimize your pain. Depending on the severity of the muscle strain, recovery time may vary from a few weeks to several months. Severe injuries often require 4-6 weeks for recovery. In those cases, complete healing can take 4-5 months. Follow these instructions at home: PRICE Therapy   Protect the muscle from being injured again.  Rest. Do not use the strained muscle if it causes pain.  If directed, put ice on the injured area: ? Put  ice in a plastic bag. ? Place a towel between your skin and the bag. ? Leave the ice on for 20 minutes, 2-3 times a day. Do this for the first 2 days after the injury.  Apply compression by wrapping the injured area with an elastic bandage as told by your health care provider.  Raise (elevate) the injured area above the level of your heart while you are sitting or lying down. General instructions  Take over-the-counter and prescription medicines only as told by your health care provider.  Walk, stretch, and do exercises as told by your health care provider. Only do these activities if you can do so without any pain.  Follow your treatment plan as told by your health care provider. This may include: ? Physical therapy. ? Massage. ? Local electrical stimulation (transcutaneous electrical nerve stimulation, TENS). How is this prevented?  Warm up and stretch before being active.  Cool down and stretch after being active.  Give your body time to rest between periods of activity.  Make sure to use equipment that fits you.  Be safe and responsible while being active to avoid slips and falls.  Maintain physical fitness, including: ? Proper conditioning in the adductor muscles. ? Overall strength, flexibility, and endurance. Contact a health care provider if:  You have increased pain or swelling in the affected area.  Your symptoms are not improving or they are getting worse. Summary  An adductor muscle strain, also called a groin strain or pull, is an injury to the muscles or tendons on the upper, inner part of the thigh.  A muscle strain occurs when a muscle is overstretched and some muscle fibers are torn.  Depending on the severity of the muscle strain, recovery time may vary from a few weeks to several months. This information is not intended to replace advice given to you by your health care provider. Make sure you discuss any questions you have with your health care  provider. Document Released: 02/12/2004 Document Revised: 11/16/2017 Document Reviewed: 11/16/2017 Elsevier Interactive Patient Education  2019 [employer].

## 2019-03-30 ENCOUNTER — Ambulatory Visit: Payer: BLUE CROSS/BLUE SHIELD

## 2019-04-05 ENCOUNTER — Other Ambulatory Visit: Payer: Self-pay | Admitting: Student

## 2019-04-05 DIAGNOSIS — Z1231 Encounter for screening mammogram for malignant neoplasm of breast: Secondary | ICD-10-CM

## 2019-04-07 ENCOUNTER — Ambulatory Visit
Admission: RE | Admit: 2019-04-07 | Discharge: 2019-04-07 | Disposition: A | Discharge status: Home / Self Care | Payer: BC Managed Care – PPO | Source: Ambulatory Visit | Attending: Student | Admitting: Student

## 2019-04-07 DIAGNOSIS — Z1231 Encounter for screening mammogram for malignant neoplasm of breast: Secondary | ICD-10-CM | POA: Diagnosis present

## 2019-05-04 ENCOUNTER — Other Ambulatory Visit: Payer: Self-pay

## 2019-05-04 ENCOUNTER — Ambulatory Visit: Payer: BC Managed Care – PPO

## 2019-05-04 DIAGNOSIS — Z23 Encounter for immunization: Secondary | ICD-10-CM

## 2019-08-02 ENCOUNTER — Ambulatory Visit: Payer: BC Managed Care – PPO | Admitting: Student in an Organized Health Care Education/Training Program

## 2019-09-09 ENCOUNTER — Ambulatory Visit: Payer: BC Managed Care – PPO | Attending: Internal Medicine

## 2019-09-09 ENCOUNTER — Other Ambulatory Visit: Payer: Self-pay

## 2019-09-09 DIAGNOSIS — Z23 Encounter for immunization: Secondary | ICD-10-CM

## 2019-09-09 NOTE — Progress Notes (Signed)
   Covid-19 Vaccination Clinic  Name:  Traci Ray    MRN: [id number] DOB: November 29, 1955  09/09/2019  Traci Ray was observed post Covid-19 immunization for 15 minutes without incident. She was provided with Vaccine Information Sheet and instruction to access the V-Safe system.   Traci Ray was instructed to call 911 with any severe reactions post vaccine: Marland Kitchen Difficulty breathing  . Swelling of face and throat  . A fast heartbeat  . A bad rash all over body  . Dizziness and weakness   Immunizations Administered    Name Date Dose VIS Date Route   Pfizer COVID-19 Vaccine 09/09/2019  9:43 AM 0.3 mL 06/10/2019 Intramuscular   Manufacturer: [employer], [employer]   Lot: [id number]   NDC: [address]-7

## 2019-10-05 ENCOUNTER — Ambulatory Visit: Payer: BC Managed Care – PPO | Attending: Internal Medicine

## 2019-10-05 DIAGNOSIS — Z23 Encounter for immunization: Secondary | ICD-10-CM

## 2019-10-05 NOTE — Progress Notes (Signed)
   Covid-19 Vaccination Clinic  Name:  Safa Derner    MRN: [id number] DOB: 07-24-1955  10/05/2019  Ms. Bolanos was observed post Covid-19 immunization for 15 minutes without incident. She was provided with Vaccine Information Sheet and instruction to access the V-Safe system.   Ms. Barren was instructed to call 911 with any severe reactions post vaccine: Marland Kitchen Difficulty breathing  . Swelling of face and throat  . A fast heartbeat  . A bad rash all over body  . Dizziness and weakness   Immunizations Administered    Name Date Dose VIS Date Route   Pfizer COVID-19 Vaccine 10/05/2019  8:13 AM 0.3 mL 06/10/2019 Intramuscular   Manufacturer: [employer], [employer]   Lot: [id number]   NDC: [address]-2

## 2020-06-05 ENCOUNTER — Other Ambulatory Visit: Payer: Self-pay | Admitting: Student

## 2020-06-05 DIAGNOSIS — Z1231 Encounter for screening mammogram for malignant neoplasm of breast: Secondary | ICD-10-CM

## 2020-07-17 ENCOUNTER — Ambulatory Visit
Admission: RE | Admit: 2020-07-17 | Discharge: 2020-07-17 | Disposition: A | Discharge status: Home / Self Care | Payer: BC Managed Care – PPO | Source: Ambulatory Visit | Attending: Student | Admitting: Student

## 2020-07-17 ENCOUNTER — Other Ambulatory Visit: Payer: Self-pay

## 2020-07-17 DIAGNOSIS — Z1231 Encounter for screening mammogram for malignant neoplasm of breast: Secondary | ICD-10-CM | POA: Insufficient documentation

## 2021-09-14 IMAGING — MG DIGITAL SCREENING BILAT W/ TOMO W/ CAD
8 series · 8 of 24 positions shown · non-contrast
Comparison: Previous exam(s).

CLINICAL DATA: Screening.

EXAM:
DIGITAL SCREENING BILATERAL MAMMOGRAM WITH TOMO AND CAD

[R MLO synth-2D]
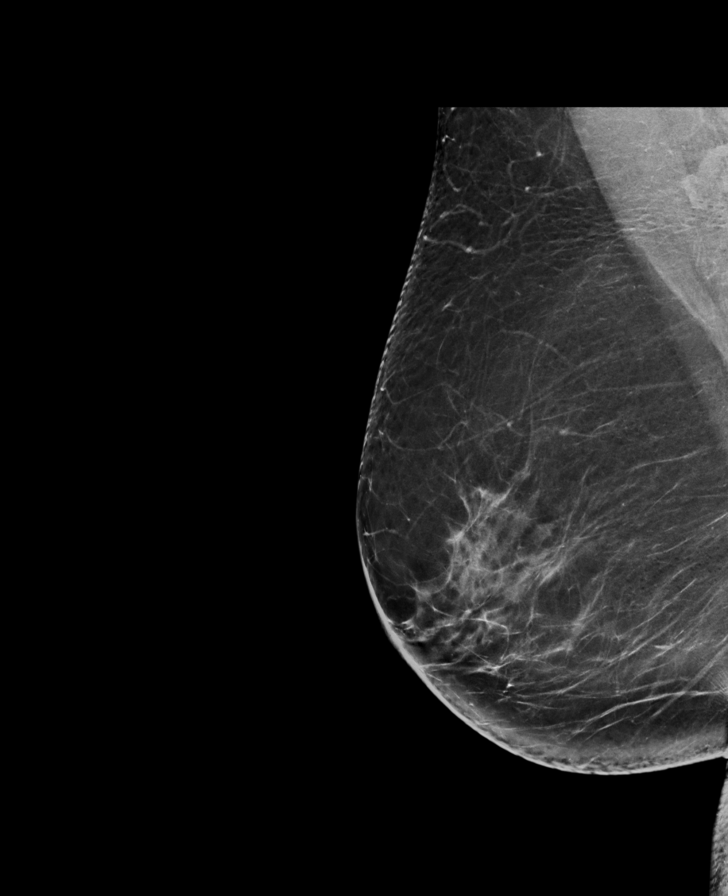

[R CC synth-2D]
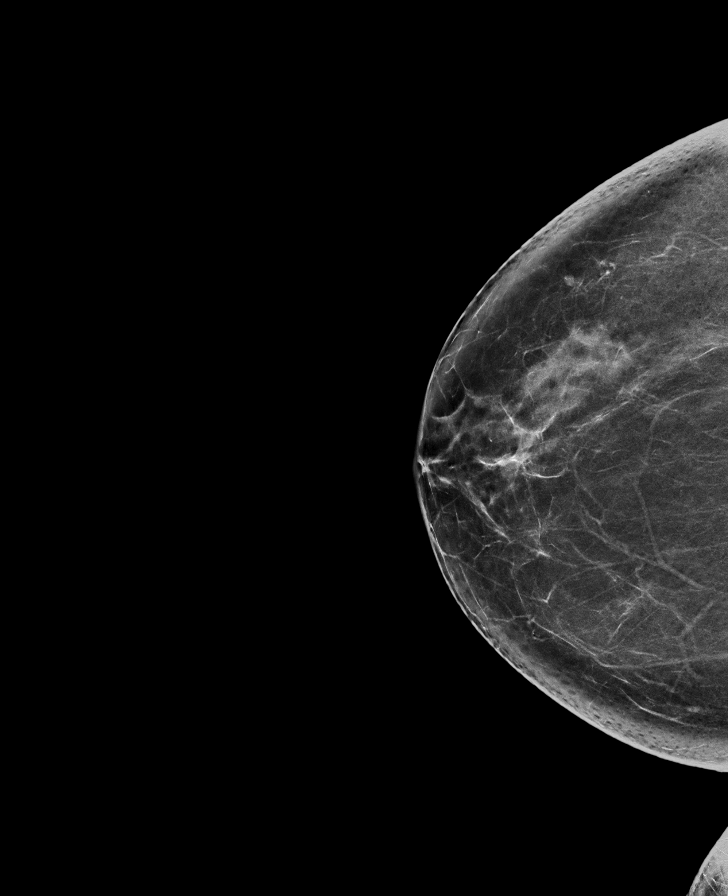

[L CC synth-2D]
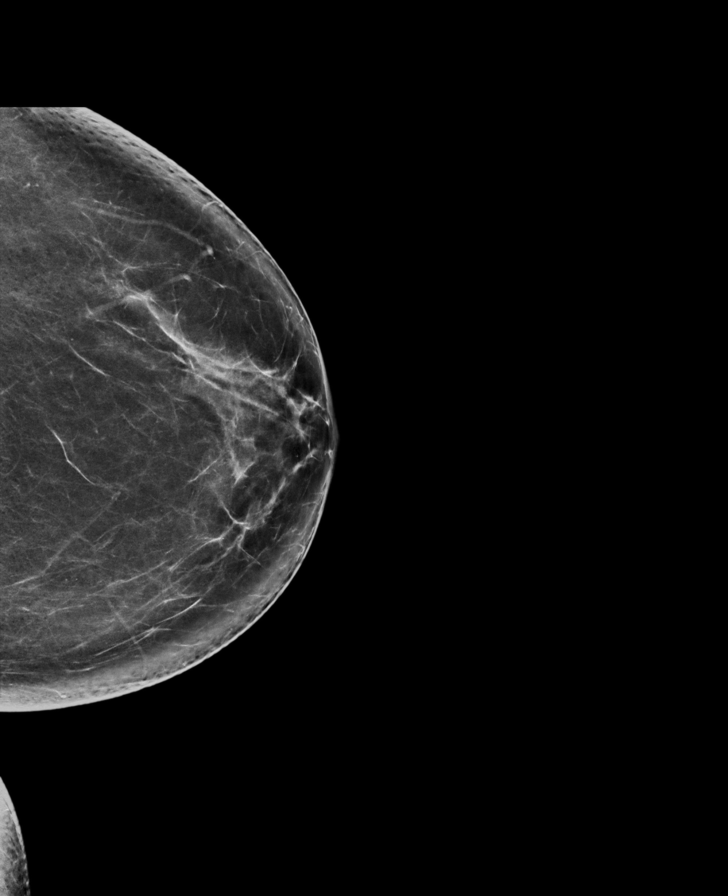

[L MLO synth-2D]
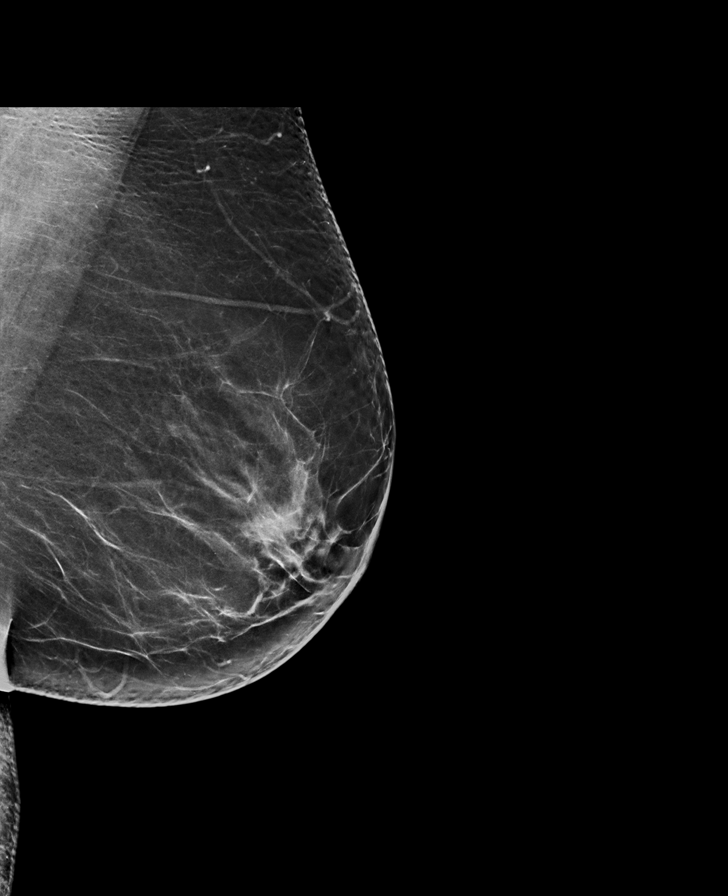

[R MLO tomo · tomo slice 45/88.0]
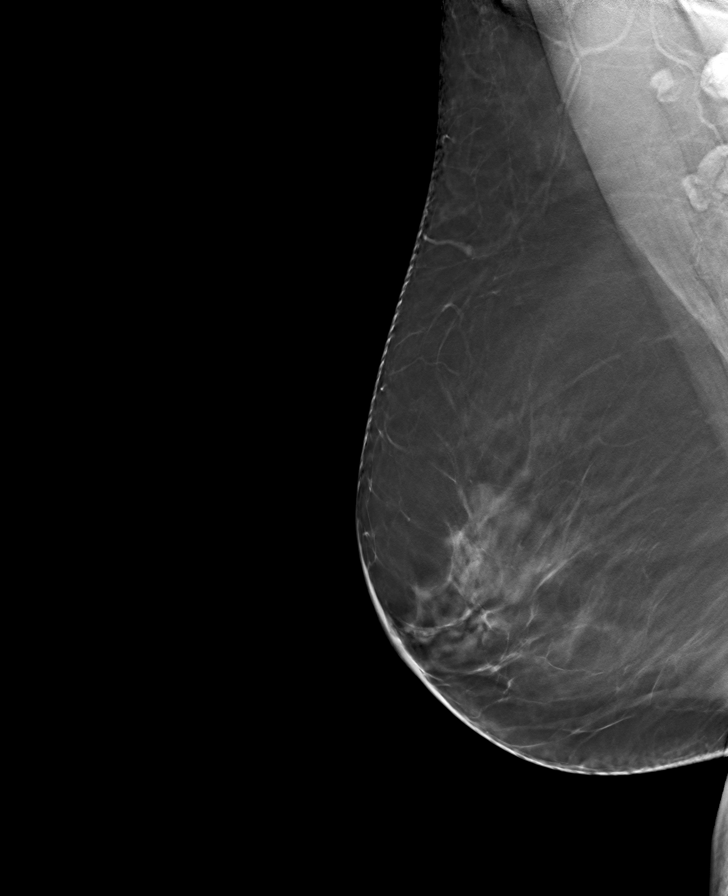

[L MLO tomo · tomo slice 43/84.0]
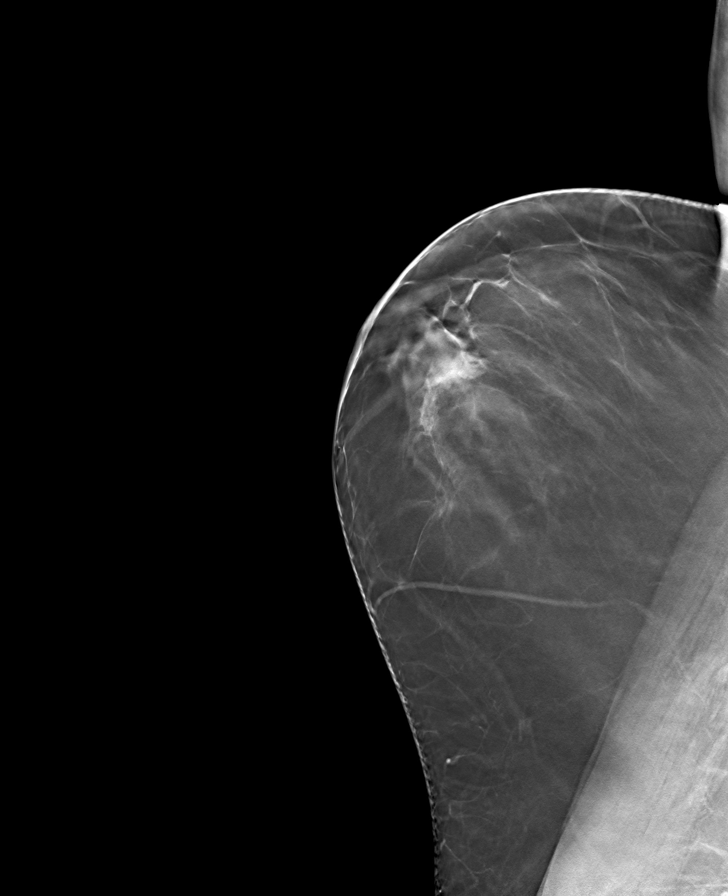

[L CC tomo · tomo slice 41/82.0]
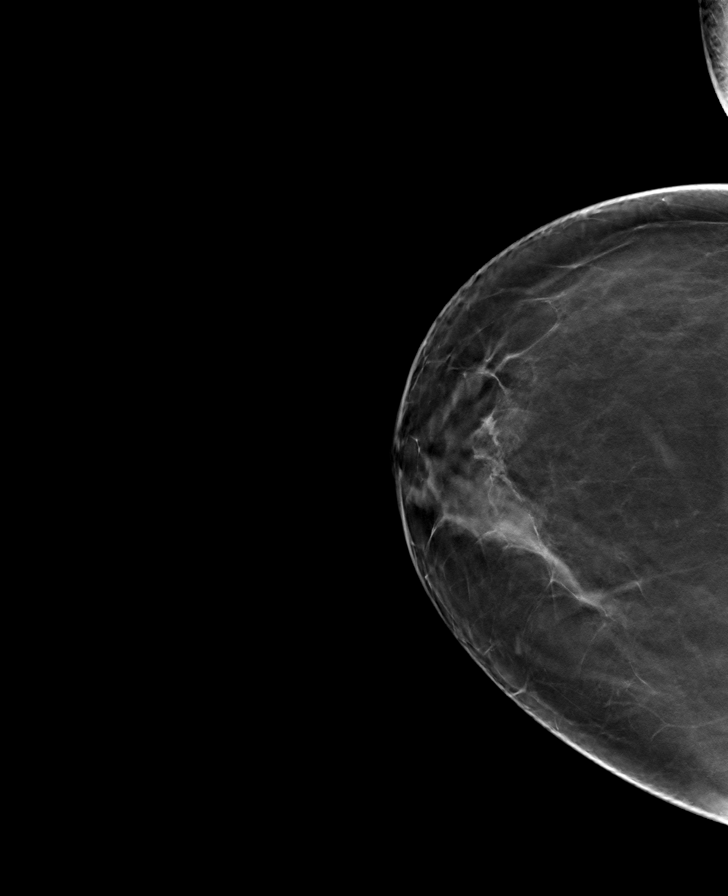

[R CC tomo · tomo slice 41/80.0]
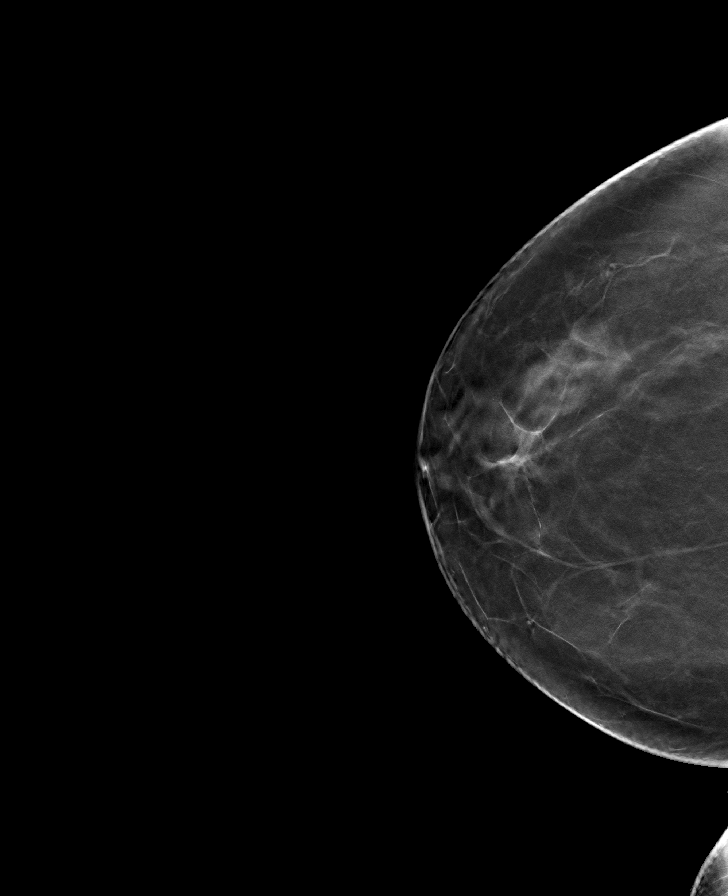

[8 of 24 positions shown; findings below may reference images not displayed]

ACR Breast Density Category b: There are scattered areas of
fibroglandular density.
FINDINGS: There are no findings suspicious for malignancy. Images were
processed with CAD.
IMPRESSION: No mammographic evidence of malignancy. A result letter of this
screening mammogram will be mailed directly to the patient.

RECOMMENDATION:
Screening mammogram in one year. (Code:[id number])

BI-RADS CATEGORY  1: Negative.
